# Patient Record
Sex: Female | Born: 1975 | Race: Black or African American | Hispanic: No | Marital: Single | Smoking: Current every day smoker
Health system: Southern US, Community
[De-identification: ages and names within clinical notes are randomized; demographics above are authoritative.]

## PROBLEM LIST (undated history)

## (undated) DIAGNOSIS — G8929 Other chronic pain: Secondary | ICD-10-CM

## (undated) DIAGNOSIS — M797 Fibromyalgia: Secondary | ICD-10-CM

## (undated) DIAGNOSIS — M25569 Pain in unspecified knee: Secondary | ICD-10-CM

## (undated) DIAGNOSIS — E7800 Pure hypercholesterolemia, unspecified: Secondary | ICD-10-CM

## (undated) DIAGNOSIS — I499 Cardiac arrhythmia, unspecified: Secondary | ICD-10-CM

## (undated) HISTORY — PX: CERVICAL DISCECTOMY: SHX98

---

## 2005-09-10 ENCOUNTER — Emergency Department: Payer: Self-pay | Admitting: Emergency Medicine

## 2006-05-03 ENCOUNTER — Other Ambulatory Visit: Payer: Self-pay

## 2006-05-03 ENCOUNTER — Emergency Department: Payer: Self-pay | Admitting: Emergency Medicine

## 2007-07-18 ENCOUNTER — Other Ambulatory Visit: Payer: Self-pay

## 2007-07-18 ENCOUNTER — Emergency Department: Payer: Self-pay | Admitting: Emergency Medicine

## 2008-07-04 ENCOUNTER — Emergency Department: Payer: Self-pay | Admitting: Emergency Medicine

## 2008-07-10 DIAGNOSIS — G4733 Obstructive sleep apnea (adult) (pediatric): Secondary | ICD-10-CM | POA: Insufficient documentation

## 2008-07-18 ENCOUNTER — Emergency Department: Payer: Self-pay

## 2009-12-01 ENCOUNTER — Ambulatory Visit: Payer: Self-pay | Admitting: Cardiovascular Disease

## 2010-02-14 ENCOUNTER — Ambulatory Visit: Payer: Self-pay | Admitting: Gastroenterology

## 2010-05-20 ENCOUNTER — Emergency Department: Payer: Self-pay | Admitting: Emergency Medicine

## 2010-06-09 ENCOUNTER — Emergency Department: Payer: Self-pay | Admitting: Emergency Medicine

## 2011-02-06 ENCOUNTER — Emergency Department: Payer: Self-pay | Admitting: Emergency Medicine

## 2011-07-20 ENCOUNTER — Emergency Department: Payer: Self-pay | Admitting: *Deleted

## 2011-07-20 LAB — URINALYSIS, COMPLETE
Bacteria: NONE SEEN
Bilirubin,UR: NEGATIVE
Blood: NEGATIVE
Ketone: NEGATIVE
Nitrite: NEGATIVE
Protein: NEGATIVE
RBC,UR: 4 /HPF (ref 0–5)
Specific Gravity: 1.02 (ref 1.003–1.030)
WBC UR: 25 /HPF (ref 0–5)

## 2011-07-20 LAB — WET PREP, GENITAL

## 2011-07-20 LAB — PREGNANCY, URINE: Pregnancy Test, Urine: NEGATIVE m[IU]/mL

## 2011-11-23 ENCOUNTER — Emergency Department: Payer: Self-pay | Admitting: Emergency Medicine

## 2011-12-12 ENCOUNTER — Emergency Department: Payer: Self-pay | Admitting: *Deleted

## 2011-12-12 LAB — CBC
HCT: 40.5 % (ref 35.0–47.0)
MCH: 24.7 pg — ABNORMAL LOW (ref 26.0–34.0)
MCHC: 31.5 g/dL — ABNORMAL LOW (ref 32.0–36.0)
MCV: 78 fL — ABNORMAL LOW (ref 80–100)

## 2011-12-12 LAB — BASIC METABOLIC PANEL
Anion Gap: 9 (ref 7–16)
BUN: 13 mg/dL (ref 7–18)
Calcium, Total: 9.3 mg/dL (ref 8.5–10.1)
Co2: 24 mmol/L (ref 21–32)
EGFR (African American): >60
EGFR (Non-African Amer.): >60
Potassium: 4.3 mmol/L (ref 3.5–5.1)
Sodium: 138 mmol/L (ref 136–145)

## 2011-12-12 LAB — CK TOTAL AND CKMB (NOT AT ARMC): CK-MB: 1.3 ng/mL (ref 0.5–3.6)

## 2012-06-24 ENCOUNTER — Ambulatory Visit: Payer: Self-pay | Admitting: Neurology

## 2012-11-13 ENCOUNTER — Ambulatory Visit: Payer: Self-pay | Admitting: Nurse Practitioner

## 2012-11-29 ENCOUNTER — Ambulatory Visit: Payer: Self-pay | Admitting: Nurse Practitioner

## 2013-02-10 ENCOUNTER — Ambulatory Visit: Payer: Self-pay | Admitting: Pain Medicine

## 2013-02-20 ENCOUNTER — Ambulatory Visit: Payer: Self-pay | Admitting: Pain Medicine

## 2013-04-02 ENCOUNTER — Ambulatory Visit: Payer: Self-pay | Admitting: Pain Medicine

## 2013-04-02 DIAGNOSIS — M797 Fibromyalgia: Secondary | ICD-10-CM | POA: Diagnosis present

## 2013-04-15 ENCOUNTER — Ambulatory Visit: Payer: Self-pay | Admitting: Pain Medicine

## 2013-04-22 ENCOUNTER — Emergency Department: Payer: Self-pay | Admitting: Emergency Medicine

## 2013-04-22 LAB — URINALYSIS, COMPLETE
Bilirubin,UR: NEGATIVE
Blood: NEGATIVE
Ketone: NEGATIVE
Nitrite: NEGATIVE
Ph: 6 (ref 4.5–8.0)
RBC,UR: 9 /HPF (ref 0–5)
WBC UR: 37 /HPF (ref 0–5)

## 2013-04-22 LAB — DIFFERENTIAL
Basophil #: 0.1 10*3/uL (ref 0.0–0.1)
Basophil %: 0.8 %
Eosinophil %: 1.5 %
Lymphocyte #: 4.5 10*3/uL — ABNORMAL HIGH (ref 1.0–3.6)
Lymphocyte %: 33.5 %
Neutrophil #: 7.9 10*3/uL — ABNORMAL HIGH (ref 1.4–6.5)
Neutrophil %: 58.5 %

## 2013-04-22 LAB — COMPREHENSIVE METABOLIC PANEL
Albumin: 3.3 g/dL — ABNORMAL LOW (ref 3.4–5.0)
Alkaline Phosphatase: 108 U/L (ref 50–136)
Anion Gap: 7 (ref 7–16)
BUN: 7 mg/dL (ref 7–18)
Bilirubin,Total: 0.3 mg/dL (ref 0.2–1.0)
Calcium, Total: 8.8 mg/dL (ref 8.5–10.1)
Chloride: 105 mmol/L (ref 98–107)
Creatinine: 1.01 mg/dL (ref 0.60–1.30)
EGFR (African American): >60
EGFR (Non-African Amer.): >60
Osmolality: 268 (ref 275–301)
Potassium: 3.9 mmol/L (ref 3.5–5.1)
SGOT(AST): 34 U/L (ref 15–37)
SGPT (ALT): 47 U/L (ref 12–78)
Sodium: 135 mmol/L — ABNORMAL LOW (ref 136–145)

## 2013-04-22 LAB — CBC
HCT: 41.6 % (ref 35.0–47.0)
HGB: 13.3 g/dL (ref 12.0–16.0)
MCH: 24.1 pg — ABNORMAL LOW (ref 26.0–34.0)
MCHC: 31.9 g/dL — ABNORMAL LOW (ref 32.0–36.0)
MCV: 76 fL — ABNORMAL LOW (ref 80–100)
Platelet: 328 10*3/uL (ref 150–440)
RBC: 5.52 10*6/uL — ABNORMAL HIGH (ref 3.80–5.20)
RDW: 14.5 % (ref 11.5–14.5)
WBC: 13.5 10*3/uL — ABNORMAL HIGH (ref 3.6–11.0)

## 2013-04-23 LAB — TROPONIN I: Troponin-I: <0.02 ng/mL

## 2013-05-19 ENCOUNTER — Ambulatory Visit: Payer: Self-pay | Admitting: Pain Medicine

## 2013-06-13 DIAGNOSIS — F1190 Opioid use, unspecified, uncomplicated: Secondary | ICD-10-CM | POA: Insufficient documentation

## 2013-12-19 DIAGNOSIS — M51369 Other intervertebral disc degeneration, lumbar region without mention of lumbar back pain or lower extremity pain: Secondary | ICD-10-CM | POA: Insufficient documentation

## 2013-12-19 DIAGNOSIS — M5136 Other intervertebral disc degeneration, lumbar region: Secondary | ICD-10-CM | POA: Insufficient documentation

## 2013-12-19 DIAGNOSIS — F319 Bipolar disorder, unspecified: Secondary | ICD-10-CM | POA: Diagnosis present

## 2013-12-19 DIAGNOSIS — K219 Gastro-esophageal reflux disease without esophagitis: Secondary | ICD-10-CM | POA: Insufficient documentation

## 2013-12-19 DIAGNOSIS — M5030 Other cervical disc degeneration, unspecified cervical region: Secondary | ICD-10-CM | POA: Insufficient documentation

## 2014-08-02 ENCOUNTER — Emergency Department: Payer: Self-pay | Admitting: Emergency Medicine

## 2014-08-02 LAB — CBC
HCT: 42.7 % (ref 35.0–47.0)
HGB: 13.2 g/dL (ref 12.0–16.0)
MCH: 23.9 pg — AB (ref 26.0–34.0)
MCHC: 31 g/dL — ABNORMAL LOW (ref 32.0–36.0)
MCV: 77 fL — ABNORMAL LOW (ref 80–100)
PLATELETS: 335 10*3/uL (ref 150–440)
RBC: 5.53 10*6/uL — ABNORMAL HIGH (ref 3.80–5.20)
RDW: 13.5 % (ref 11.5–14.5)
WBC: 11.4 10*3/uL — ABNORMAL HIGH (ref 3.6–11.0)

## 2014-08-02 LAB — BASIC METABOLIC PANEL
Anion Gap: 7 (ref 7–16)
BUN: 6 mg/dL — AB (ref 7–18)
Calcium, Total: 8.9 mg/dL (ref 8.5–10.1)
Chloride: 108 mmol/L — ABNORMAL HIGH (ref 98–107)
Co2: 25 mmol/L (ref 21–32)
Creatinine: 1.1 mg/dL (ref 0.60–1.30)
GFR CALC NON AF AMER: 58 — AB
Glucose: 86 mg/dL (ref 65–99)
Osmolality: 276 (ref 275–301)
Potassium: 3.7 mmol/L (ref 3.5–5.1)
Sodium: 140 mmol/L (ref 136–145)

## 2014-08-02 LAB — D-DIMER(ARMC): D-Dimer: 349 ng/ml

## 2014-08-02 LAB — TROPONIN I

## 2014-08-02 LAB — PRO B NATRIURETIC PEPTIDE: B-Type Natriuretic Peptide: 44 pg/mL (ref 0–125)

## 2014-08-04 ENCOUNTER — Emergency Department: Payer: Self-pay | Admitting: Emergency Medicine

## 2014-08-04 LAB — BASIC METABOLIC PANEL
Anion Gap: 7 (ref 7–16)
BUN: 7 mg/dL (ref 7–18)
CALCIUM: 8.9 mg/dL (ref 8.5–10.1)
Chloride: 106 mmol/L (ref 98–107)
Co2: 28 mmol/L (ref 21–32)
Creatinine: 1.18 mg/dL (ref 0.60–1.30)
EGFR (Non-African Amer.): 54 — ABNORMAL LOW
Glucose: 95 mg/dL (ref 65–99)
Osmolality: 279 (ref 275–301)
Potassium: 3.9 mmol/L (ref 3.5–5.1)
Sodium: 141 mmol/L (ref 136–145)

## 2014-08-04 LAB — CBC
HCT: 42.1 % (ref 35.0–47.0)
HGB: 13.1 g/dL (ref 12.0–16.0)
MCH: 24 pg — ABNORMAL LOW (ref 26.0–34.0)
MCHC: 31.2 g/dL — AB (ref 32.0–36.0)
MCV: 77 fL — ABNORMAL LOW (ref 80–100)
PLATELETS: 336 10*3/uL (ref 150–440)
RBC: 5.46 10*6/uL — ABNORMAL HIGH (ref 3.80–5.20)
RDW: 13.5 % (ref 11.5–14.5)
WBC: 9.9 10*3/uL (ref 3.6–11.0)

## 2014-08-04 LAB — PRO B NATRIURETIC PEPTIDE: B-Type Natriuretic Peptide: 12 pg/mL (ref 0–125)

## 2014-08-04 LAB — TROPONIN I: Troponin-I: <0.02 ng/mL

## 2014-08-05 LAB — TROPONIN I: Troponin-I: <0.02 ng/mL

## 2015-02-25 ENCOUNTER — Emergency Department: Payer: Self-pay

## 2015-02-25 ENCOUNTER — Encounter: Payer: Self-pay | Admitting: Emergency Medicine

## 2015-02-25 ENCOUNTER — Other Ambulatory Visit: Payer: Self-pay

## 2015-02-25 ENCOUNTER — Emergency Department
Admission: EM | Admit: 2015-02-25 | Discharge: 2015-02-25 | Discharge status: Against Medical Advice | Payer: Self-pay | Attending: Student | Admitting: Student

## 2015-02-25 DIAGNOSIS — R110 Nausea: Secondary | ICD-10-CM | POA: Insufficient documentation

## 2015-02-25 DIAGNOSIS — R079 Chest pain, unspecified: Secondary | ICD-10-CM | POA: Insufficient documentation

## 2015-02-25 DIAGNOSIS — R0602 Shortness of breath: Secondary | ICD-10-CM | POA: Insufficient documentation

## 2015-02-25 LAB — CBC
HCT: 41.3 % (ref 35.0–47.0)
Hemoglobin: 12.9 g/dL (ref 12.0–16.0)
MCH: 24 pg — ABNORMAL LOW (ref 26.0–34.0)
MCHC: 31.4 g/dL — AB (ref 32.0–36.0)
MCV: 76.6 fL — ABNORMAL LOW (ref 80.0–100.0)
PLATELETS: 301 10*3/uL (ref 150–440)
RBC: 5.39 MIL/uL — ABNORMAL HIGH (ref 3.80–5.20)
RDW: 14.2 % (ref 11.5–14.5)
WBC: 10.6 10*3/uL (ref 3.6–11.0)

## 2015-02-25 LAB — BASIC METABOLIC PANEL
Anion gap: 7 (ref 5–15)
BUN: 8 mg/dL (ref 6–20)
CALCIUM: 9 mg/dL (ref 8.9–10.3)
CO2: 24 mmol/L (ref 22–32)
Chloride: 106 mmol/L (ref 101–111)
Creatinine, Ser: 1.06 mg/dL — ABNORMAL HIGH (ref 0.44–1.00)
GFR calc Af Amer: >60 mL/min (ref >60)
Glucose, Bld: 124 mg/dL — ABNORMAL HIGH (ref 65–99)
POTASSIUM: 3.9 mmol/L (ref 3.5–5.1)
SODIUM: 137 mmol/L (ref 135–145)

## 2015-02-25 NOTE — ED Notes (Signed)
Patient to ED with c/o chest pain and some nausea since yesterday patient reports some shortness of breath.

## 2015-07-21 ENCOUNTER — Emergency Department: Payer: Medicaid Other

## 2015-07-21 ENCOUNTER — Emergency Department
Admission: EM | Admit: 2015-07-21 | Discharge: 2015-07-21 | Disposition: A | Discharge status: Home / Self Care | Payer: Medicaid Other | Attending: Emergency Medicine | Admitting: Emergency Medicine

## 2015-07-21 DIAGNOSIS — M25552 Pain in left hip: Secondary | ICD-10-CM | POA: Insufficient documentation

## 2015-07-21 DIAGNOSIS — Y998 Other external cause status: Secondary | ICD-10-CM | POA: Insufficient documentation

## 2015-07-21 DIAGNOSIS — W009XXA Unspecified fall due to ice and snow, initial encounter: Secondary | ICD-10-CM

## 2015-07-21 DIAGNOSIS — S39012A Strain of muscle, fascia and tendon of lower back, initial encounter: Secondary | ICD-10-CM | POA: Insufficient documentation

## 2015-07-21 DIAGNOSIS — Y9389 Activity, other specified: Secondary | ICD-10-CM | POA: Insufficient documentation

## 2015-07-21 DIAGNOSIS — Z3202 Encounter for pregnancy test, result negative: Secondary | ICD-10-CM | POA: Insufficient documentation

## 2015-07-21 DIAGNOSIS — S79922A Unspecified injury of left thigh, initial encounter: Secondary | ICD-10-CM | POA: Insufficient documentation

## 2015-07-21 DIAGNOSIS — W000XXA Fall on same level due to ice and snow, initial encounter: Secondary | ICD-10-CM | POA: Insufficient documentation

## 2015-07-21 DIAGNOSIS — Y92009 Unspecified place in unspecified non-institutional (private) residence as the place of occurrence of the external cause: Secondary | ICD-10-CM | POA: Insufficient documentation

## 2015-07-21 DIAGNOSIS — S300XXA Contusion of lower back and pelvis, initial encounter: Secondary | ICD-10-CM | POA: Insufficient documentation

## 2015-07-21 LAB — URINALYSIS COMPLETE WITH MICROSCOPIC (ARMC ONLY)
BACTERIA UA: NONE SEEN
Bilirubin Urine: NEGATIVE
GLUCOSE, UA: NEGATIVE mg/dL
Hgb urine dipstick: NEGATIVE
Ketones, ur: NEGATIVE mg/dL
Leukocytes, UA: NEGATIVE
NITRITE: NEGATIVE
Protein, ur: NEGATIVE mg/dL
Specific Gravity, Urine: 1.014 (ref 1.005–1.030)
pH: 6 (ref 5.0–8.0)

## 2015-07-21 MED ORDER — CYCLOBENZAPRINE HCL 10 MG PO TABS
10.0000 mg | ORAL_TABLET | Freq: Once | ORAL | Status: DC
  Filled 2015-07-21: qty 1

## 2015-07-21 MED ORDER — CYCLOBENZAPRINE HCL 5 MG PO TABS: 5.0000 mg | ORAL_TABLET | Freq: Three times a day (TID) | ORAL | Status: DC | PRN

## 2015-07-21 MED ORDER — KETOROLAC TROMETHAMINE 10 MG PO TABS: 10.0000 mg | ORAL_TABLET | Freq: Three times a day (TID) | ORAL | Status: DC

## 2015-07-21 MED ORDER — TRAMADOL HCL 50 MG PO TABS: 50.0000 mg | ORAL_TABLET | Freq: Two times a day (BID) | ORAL | Status: DC

## 2015-07-21 MED ORDER — KETOROLAC TROMETHAMINE 60 MG/2ML IM SOLN
60.0000 mg | Freq: Once | INTRAMUSCULAR | Status: AC
  Administered 2015-07-21: 60 mg via INTRAMUSCULAR
  Filled 2015-07-21: qty 2

## 2015-07-21 NOTE — ED Notes (Signed)
Pt comes to ED w/ c/o pain in L arm, lower back and L leg after fall yesterday.  Pt denies LOC, CP or SOB. NAD.  Pt able to ambulate to room

## 2015-07-21 NOTE — Discharge Instructions (Signed)
Lumbosacral Strain Lumbosacral strain is a strain of any of the parts that make up your lumbosacral vertebrae. Your lumbosacral vertebrae are the bones that make up the lower third of your backbone. Your lumbosacral vertebrae are held together by muscles and tough, fibrous tissue (ligaments).  CAUSES  A sudden blow to your back can cause lumbosacral strain. Also, anything that causes an excessive stretch of the muscles in the low back can cause this strain. This is typically seen when people exert themselves strenuously, fall, lift heavy objects, bend, or crouch repeatedly. RISK FACTORS  Physically demanding work.  Participation in pushing or pulling sports or sports that require a sudden twist of the back (tennis, golf, baseball).  Weight lifting.  Excessive lower back curvature.  Forward-tilted pelvis.  Weak back or abdominal muscles or both.  Tight hamstrings. SIGNS AND SYMPTOMS  Lumbosacral strain may cause pain in the area of your injury or pain that moves (radiates) down your leg.  DIAGNOSIS Your health care provider can often diagnose lumbosacral strain through a physical exam. In some cases, you may need tests such as X-ray exams.  TREATMENT  Treatment for your lower back injury depends on many factors that your clinician will have to evaluate. However, most treatment will include the use of anti-inflammatory medicines. HOME CARE INSTRUCTIONS   Avoid hard physical activities (tennis, racquetball, waterskiing) if you are not in proper physical condition for it. This may aggravate or create problems.  If you have a back problem, avoid sports requiring sudden body movements. Swimming and walking are generally safer activities.  Maintain good posture.  Maintain a healthy weight.  For acute conditions, you may put ice on the injured area.  Put ice in a plastic bag.  Place a towel between your skin and the bag.  Leave the ice on for 20 minutes, 2-3 times a day.  When the  low back starts healing, stretching and strengthening exercises may be recommended. SEEK MEDICAL CARE IF:  Your back pain is getting worse.  You experience severe back pain not relieved with medicines. SEEK IMMEDIATE MEDICAL CARE IF:   You have numbness, tingling, weakness, or problems with the use of your arms or legs.  There is a change in bowel or bladder control.  You have increasing pain in any area of the body, including your belly (abdomen).  You notice shortness of breath, dizziness, or feel faint.  You feel sick to your stomach (nauseous), are throwing up (vomiting), or become sweaty.  You notice discoloration of your toes or legs, or your feet get very cold. MAKE SURE YOU:   Understand these instructions.  Will watch your condition.  Will get help right away if you are not doing well or get worse.   This information is not intended to replace advice given to you by your health care provider. Make sure you discuss any questions you have with your health care provider.   Document Released: 04/05/2005 Document Revised: 07/17/2014 Document Reviewed: 02/12/2013 Elsevier Interactive Patient Education [employer]  Your exam is normal today following your fall. There if no evidence of fracture to the tailbone. You should apply warm compresses to any sore muscles. Follow-up with your provider for ongoing symptoms. Take the prescription meds as directed.

## 2015-07-21 NOTE — ED Notes (Signed)
POC PREG NEGATIVE  

## 2015-07-21 NOTE — ED Notes (Signed)
Fall on ice yesterday, pain to tailbone and lower back. Pt alert and oriented X4, active, cooperative, pt in NAD. RR even and unlabored, color WNL.  Ambulatory.

## 2015-07-21 NOTE — ED Provider Notes (Signed)
[facility] Emergency Department Provider Note ____________________________________________  Time seen: 1803  I have reviewed the triage vital signs and the nursing notes.  HISTORY  Chief Complaint  Fall  HPI Priscilla Moore is a 40 y.o. female presents to the ED for evaluation of low back pain and tailbone pain status post a fall yesterday. She describes slipping on icy curb outside of her house. She landed on her buttocks and lower back. She denies any head injury, loss of consciousness, laceration, or abrasion. She has been applying heating pad to the area for comfort in the last 24 hours. She also notes some pain over the left buttocks and left anterior thigh. She denies any distal paresthesias, foot drop, or leg weakness. She is dosed her Ultram but denies any significant benefit to her symptoms.She rates the discomfort in her low back as an 8/10 in triage.  History reviewed. No pertinent past medical history.  There are no active problems to display for this patient.  History reviewed. No pertinent past surgical history.   Current Outpatient Rx  Name  Route  Sig  Dispense  Refill  . cyclobenzaprine (FLEXERIL) 5 MG tablet   Oral   Take 1 tablet (5 mg total) by mouth 3 (three) times daily as needed for muscle spasms.   15 tablet   0   . ketorolac (TORADOL) 10 MG tablet   Oral   Take 1 tablet (10 mg total) by mouth every 8 (eight) hours.   15 tablet   0   . traMADol (ULTRAM) 50 MG tablet   Oral   Take 1 tablet (50 mg total) by mouth 2 (two) times daily.   10 tablet   0    Allergies Review of patient's allergies indicates no known allergies.  No family history on file.  Social History Social History  Substance Use Topics  . Smoking status: Never Smoker   . Smokeless tobacco: None  . Alcohol Use: No   Review of Systems  Constitutional: Negative for fever. Eyes: Negative for visual changes. ENT: Negative for sore  throat. Cardiovascular: Negative for chest pain. Respiratory: Negative for shortness of breath. Gastrointestinal: Negative for abdominal pain, vomiting and diarrhea. Genitourinary: Negative for dysuria. Musculoskeletal: Positive for back pain. Skin: Negative for rash. Neurological: Negative for headaches, focal weakness or numbness. No incontinence of bladder or bowel. ____________________________________________  PHYSICAL EXAM:  VITAL SIGNS: ED Triage Vitals  Enc Vitals Group     BP 07/21/15 1748 140/83 mmHg     Pulse Rate 07/21/15 1747 90     Resp 07/21/15 1747 18     Temp 07/21/15 1747 98.2 F (36.8 C)     Temp Source 07/21/15 1747 Oral     SpO2 07/21/15 1747 98 %     Weight 07/21/15 1747 260 lb (117.935 kg)     Height 07/21/15 1747 5\' 8"  (1.727 m)     Head Cir --      Peak Flow --      Pain Score 07/21/15 1746 8     Pain Loc --      Pain Edu? --      Excl. in GC? --    Constitutional: Alert and oriented. Well appearing and in no distress. Head: Normocephalic and atraumatic.      Eyes: Conjunctivae are normal. PERRL. Normal extraocular movements      Ears: Canals clear. TMs intact bilaterally.   Nose: No congestion/rhinorrhea.   Mouth/Throat: Mucous membranes are moist.  Neck: Supple. No thyromegaly. Hematological/Lymphatic/Immunological: No cervical lymphadenopathy. Cardiovascular: Normal rate, regular rhythm.  Respiratory: Normal respiratory effort. No wheezes/rales/rhonchi. Gastrointestinal: Soft and nontender. No distention. Musculoskeletal: Patient with normal spinal alignment she is without any significant midline tenderness, spasm, or deformity. She isn't to palpation over the midline sacrum and coccyx. She also has some pain to through the left buttocks and anterior lateral hip. She is able to transition from supine to sit without difficulty. Nontender with normal range of motion in all extremities.  Neurologic: Cranial nerves II through XII grossly  intact. Normal LE DTRs bilaterally. Normal toe dorsiflexion and foot eversion exam.  She is able to demonstrated negative seated straight leg raise. Normal gait without ataxia. Normal speech and language. No gross focal neurologic deficits are appreciated. Skin:  Skin is warm, dry and intact. No rash noted. Psychiatric: Mood and affect are normal. Patient exhibits appropriate insight and judgment. ____________________________________________    LABS (pertinent positives/negatives) Labs Reviewed  URINALYSIS COMPLETEWITH MICROSCOPIC ([facility] ONLY) - Abnormal; Notable for the following:    Color, Urine YELLOW (*)    APPearance CLEAR (*)    Squamous Epithelial / LPF 0-5 (*)    All other components within normal limits  POC URINE PREG, ED  ____________________________________________   RADIOLOGY  Coccyx IMPRESSION: Negative. ____________________________________________  PROCEDURES  Toradol 60 mg IM ____________________________________________  INITIAL IMPRESSION / ASSESSMENT AND PLAN / ED COURSE  Patient with a slip and fall on ice with subsequent coccyx contusion. She also has some mild lumbar strain symptoms. She is noted to have a normal exam and negative x-ray for any coccyx injury. She'll be discharged with prescriptions for Flexeril, Ultram, and the dose as directed. Work note was provided for 2 days out of work as requested. She'll follow with primary care provider for ongoing symptoms. ____________________________________________  FINAL CLINICAL IMPRESSION(S) / ED DIAGNOSES  Final diagnoses:  Fall from slipping on ice, initial encounter  Coccyx contusion, initial encounter  Lumbar strain, initial encounter      Lissa HoardJenise V Bacon Celes Dedic, PA-C 07/21/15 1954  Governor Rooksebecca Lord, MD 07/21/15 2340

## 2016-01-22 DIAGNOSIS — M79662 Pain in left lower leg: Secondary | ICD-10-CM | POA: Insufficient documentation

## 2016-01-22 DIAGNOSIS — J40 Bronchitis, not specified as acute or chronic: Secondary | ICD-10-CM | POA: Insufficient documentation

## 2016-01-22 DIAGNOSIS — F17210 Nicotine dependence, cigarettes, uncomplicated: Secondary | ICD-10-CM | POA: Insufficient documentation

## 2016-01-22 DIAGNOSIS — Z79899 Other long term (current) drug therapy: Secondary | ICD-10-CM | POA: Insufficient documentation

## 2016-01-23 ENCOUNTER — Emergency Department: Payer: Medicaid Other

## 2016-01-23 ENCOUNTER — Encounter: Payer: Self-pay | Admitting: Emergency Medicine

## 2016-01-23 ENCOUNTER — Emergency Department
Admission: EM | Admit: 2016-01-23 | Discharge: 2016-01-23 | Disposition: A | Discharge status: Home / Self Care | Payer: Medicaid Other | Attending: Emergency Medicine | Admitting: Emergency Medicine

## 2016-01-23 DIAGNOSIS — M79605 Pain in left leg: Secondary | ICD-10-CM

## 2016-01-23 DIAGNOSIS — J40 Bronchitis, not specified as acute or chronic: Secondary | ICD-10-CM

## 2016-01-23 HISTORY — DX: Other chronic pain: G89.29

## 2016-01-23 HISTORY — DX: Pain in unspecified knee: M25.569

## 2016-01-23 HISTORY — DX: Fibromyalgia: M79.7

## 2016-01-23 HISTORY — DX: Cardiac arrhythmia, unspecified: I49.9

## 2016-01-23 HISTORY — DX: Pure hypercholesterolemia, unspecified: E78.00

## 2016-01-23 MED ORDER — HYDROCOD POLST-CPM POLST ER 10-8 MG/5ML PO SUER
5.0000 mL | Freq: Once | ORAL | Status: AC
  Administered 2016-01-23: 5 mL via ORAL
  Filled 2016-01-23: qty 5

## 2016-01-23 MED ORDER — HYDROCOD POLST-CPM POLST ER 10-8 MG/5ML PO SUER: 5.0000 mL | Freq: Two times a day (BID) | ORAL | Status: DC

## 2016-01-23 NOTE — ED Provider Notes (Signed)
[facility] Emergency Department Provider Note   ____________________________________________  Time seen: Approximately 4:22 AM  I have reviewed the triage vital signs and the nursing notes.   HISTORY  Chief Complaint Leg Pain; URI; and Shortness of Breath    HPI Priscilla Moore is a 40 y.o. female who presents to the ED from home with a chief complain of cough, runny nose and left leg pain. Patient reports onset of nonproductive cough and runny nose times one week. She is a daily smoker. Also complains of left leg pain on her outer thigh radiating to the top of her shins. Denies associated fever, chills, chest pain, shortness of breath, abdominal pain, nausea, vomiting, diarrhea. Denies recent travel, trauma or OCP use. 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 Nothing makes her symptoms better. Movement and ambulation make her leg pain worse.   Past Medical History  Diagnosis Date  . Fibromyalgia   . Knee pain, chronic   . High cholesterol   . Irregular heart beat     There are no active problems to display for this patient.   Past Surgical History  Procedure Laterality Date  . Cervical discectomy      Current Outpatient Rx  Name  Route  Sig  Dispense  Refill  . gabapentin (NEURONTIN) 300 MG capsule   Oral   Take 900 mg by mouth 3 (three) times daily.         . chlorpheniramine-HYDROcodone (TUSSIONEX PENNKINETIC ER) 10-8 MG/5ML SUER   Oral   Take 5 mLs by mouth 2 (two) times daily.   70 mL   0   . cyclobenzaprine (FLEXERIL) 5 MG tablet   Oral   Take 1 tablet (5 mg total) by mouth 3 (three) times daily as needed for muscle spasms.   15 tablet   0   . ketorolac (TORADOL) 10 MG tablet   Oral   Take 1 tablet (10 mg total) by mouth every 8 (eight) hours.   15 tablet   0   . traMADol (ULTRAM) 50 MG tablet   Oral   Take 1 tablet (50 mg total) by  mouth 2 (two) times daily.   10 tablet   0     Allergies Review of patient's allergies indicates no known allergies.  History reviewed. No pertinent family history.  Social History Social History  Substance Use Topics  . Smoking status: Current Every Day Smoker -- 1.00 packs/day for 25 years    Types: Cigarettes  . Smokeless tobacco: None  . Alcohol Use: Yes     Comment: social drinker    Review of Systems  Constitutional: No fever/chills. Eyes: No visual changes. ENT: Positive for runny nose. No sore throat. Cardiovascular: Denies chest pain. Respiratory: Positive for nonproductive cough. Denies shortness of breath. Gastrointestinal: No abdominal pain.  No nausea, no vomiting.  No diarrhea.  No constipation. Genitourinary: Negative for dysuria. Musculoskeletal: Positive for left leg pain. Negative for back pain. Skin: Negative for rash. Neurological: Negative for headaches, focal weakness or numbness.  10-point ROS otherwise negative.  ____________________________________________   PHYSICAL EXAM:  VITAL SIGNS: ED Triage Vitals  Enc Vitals Group     BP 01/22/16 2358 142/81 mmHg     Pulse Rate 01/22/16 2358 90     Resp 01/22/16 2358 20     Temp 01/22/16 2358 98.2 F (36.8 C)  Temp Source 01/22/16 2358 Oral     SpO2 01/22/16 2358 96 %     Weight 01/22/16 2358 245 lb (111.131 kg)     Height 01/22/16 2358 5\' 8"  (1.727 m)     Head Cir --      Peak Flow --      Pain Score 01/23/16 0308 9     Pain Loc --      Pain Edu? --      Excl. in GC? --     Constitutional: Alert and oriented. Well appearing and in no acute distress. Eyes: Conjunctivae are normal. PERRL. EOMI. Head: Atraumatic. Nose: No congestion/rhinnorhea. Mouth/Throat: Mucous membranes are moist.  Oropharynx non-erythematous. Neck: No stridor.   Cardiovascular: Normal rate, regular rhythm. Grossly normal heart sounds.  Good peripheral circulation. Respiratory: Normal respiratory effort.  No  retractions. Lungs CTAB. Gastrointestinal: Soft and nontender. No distention. No abdominal bruits. No CVA tenderness. Musculoskeletal: Left calf mildly tender to palpation. There is no associated swelling or edema.  No joint effusions. Full range of motion left hip, knee, ankle without difficulty. 2+ femoral and distal pulses. Symmetrically warm limb without evidence for ischemia. Neurologic:  Normal speech and language. No gross focal neurologic deficits are appreciated.  Skin:  Skin is warm, dry and intact. No rash noted. Psychiatric: Mood and affect are normal. Speech and behavior are normal.  ____________________________________________   LABS (all labs ordered are listed, but only abnormal results are displayed)  Labs Reviewed - No data to display ____________________________________________  EKG  ED ECG REPORT I, Marquetta Weiskopf J, the attending physician, personally viewed and interpreted this ECG.   Date: 01/23/2016  EKG Time: 0007  Rate: 87  Rhythm: normal EKG, normal sinus rhythm  Axis: Normal  Intervals:none  ST&T Change: Nonspecific  ____________________________________________  RADIOLOGY  Ultrasound interpreted per Dr. Andria MeuseStevens: No evidence of deep venous thrombosis.  Chest 2 view (viewed by me, interpreted per Dr. Andria MeuseStevens): Chronic bronchitic changes. No evidence of active pulmonary disease. ____________________________________________   PROCEDURES  Procedure(s) performed: None  Procedures  Critical Care performed: No  ____________________________________________   INITIAL IMPRESSION / ASSESSMENT AND PLAN / ED COURSE  Pertinent labs & imaging results that were available during my care of the patient were reviewed by me and considered in my medical decision making (see chart for details).  40 year old female who presents with viral bronchitis and musculoskeletal leg pain. Offered to check electrolytes and CK which patient declines. Patient requesting work  note so she can "rest her leg". Will prescribe Tussionex as needed for cough and patient will follow-up with her PCP next week. Strict return precautions given. Patient verbalizes understanding and agrees with plan of care. ____________________________________________   FINAL CLINICAL IMPRESSION(S) / ED DIAGNOSES  Final diagnoses:  Bronchitis  Pain of left lower extremity      NEW MEDICATIONS STARTED DURING THIS VISIT:  New Prescriptions   CHLORPHENIRAMINE-HYDROCODONE (TUSSIONEX PENNKINETIC ER) 10-8 MG/5ML SUER    Take 5 mLs by mouth 2 (two) times daily.     Note:  This document was prepared using Dragon voice recognition software and may include unintentional dictation errors.    Irean HongJade J Aqua Denslow, MD 01/23/16 [phone]

## 2016-01-23 NOTE — ED Notes (Addendum)
Pt states she only takes gabapentin because she has no insurance. Pt has a hx of irregular heart beat and was taking metoprolol for this. Pt no longer is on ASA therapy. Pt states she has felt palpitations in the recent past, but has not sought medical care for these.

## 2016-01-23 NOTE — ED Notes (Signed)
Pt c/o cough and runny nose since last Saturday; short of breath when laying down and with exertion; pt smokes 1ppd; pt denies chest pain or fever; pt also having left leg pain; starts on the outer thigh and radiates down across the top of her shin to her toes; history of back problems but never had pain in her leg like this before;

## 2016-01-23 NOTE — ED Notes (Signed)
Pt adds pain/tenderness behind left knee

## 2016-01-23 NOTE — Discharge Instructions (Signed)
1. You may take cough medicine as needed (Tussionex). 2. Return to the ER for worsening symptoms, persistent vomiting, difficulty breathing or other concerns.  Musculoskeletal Pain Musculoskeletal pain is muscle and boney aches and pains. These pains can occur in any part of the body. Your caregiver may treat you without knowing the cause of the pain. They may treat you if blood or urine tests, X-rays, and other tests were normal.  CAUSES There is often not a definite cause or reason for these pains. These pains may be caused by a type of germ (virus). The discomfort may also come from overuse. Overuse includes working out too hard when your body is not fit. Boney aches also come from weather changes. Bone is sensitive to atmospheric pressure changes. HOME CARE INSTRUCTIONS   Ask when your test results will be ready. Make sure you get your test results.  Only take over-the-counter or prescription medicines for pain, discomfort, or fever as directed by your caregiver. If you were given medications for your condition, do not drive, operate machinery or power tools, or sign legal documents for 24 hours. Do not drink alcohol. Do not take sleeping pills or other medications that may interfere with treatment.  Continue all activities unless the activities cause more pain. When the pain lessens, slowly resume normal activities. Gradually increase the intensity and duration of the activities or exercise.  During periods of severe pain, bed rest may be helpful. Lay or sit in any position that is comfortable.  Putting ice on the injured area.  Put ice in a bag.  Place a towel between your skin and the bag.  Leave the ice on for 15 to 20 minutes, 3 to 4 times a day.  Follow up with your caregiver for continued problems and no reason can be found for the pain. If the pain becomes worse or does not go away, it may be necessary to repeat tests or do additional testing. Your caregiver may need to look  further for a possible cause. SEEK IMMEDIATE MEDICAL CARE IF:  You have pain that is getting worse and is not relieved by medications.  You develop chest pain that is associated with shortness or breath, sweating, feeling sick to your stomach (nauseous), or throw up (vomit).  Your pain becomes localized to the abdomen.  You develop any new symptoms that seem different or that concern you. MAKE SURE YOU:   Understand these instructions.  Will watch your condition.  Will get help right away if you are not doing well or get worse.   This information is not intended to replace advice given to you by your health care provider. Make sure you discuss any questions you have with your health care provider.   Document Released: 06/26/2005 Document Revised: 09/18/2011 Document Reviewed: 02/28/2013 Elsevier Interactive Patient Education [employer]

## 2018-04-11 ENCOUNTER — Other Ambulatory Visit: Payer: Self-pay

## 2018-04-11 ENCOUNTER — Emergency Department: Payer: Self-pay

## 2018-04-11 ENCOUNTER — Emergency Department
Admission: EM | Admit: 2018-04-11 | Discharge: 2018-04-11 | Disposition: A | Discharge status: Home / Self Care | Payer: Self-pay | Attending: Emergency Medicine | Admitting: Emergency Medicine

## 2018-04-11 ENCOUNTER — Encounter: Payer: Self-pay | Admitting: Emergency Medicine

## 2018-04-11 DIAGNOSIS — M25561 Pain in right knee: Secondary | ICD-10-CM | POA: Insufficient documentation

## 2018-04-11 DIAGNOSIS — M79604 Pain in right leg: Secondary | ICD-10-CM

## 2018-04-11 DIAGNOSIS — F17210 Nicotine dependence, cigarettes, uncomplicated: Secondary | ICD-10-CM | POA: Insufficient documentation

## 2018-04-11 DIAGNOSIS — R0602 Shortness of breath: Secondary | ICD-10-CM

## 2018-04-11 DIAGNOSIS — M79651 Pain in right thigh: Secondary | ICD-10-CM | POA: Insufficient documentation

## 2018-04-11 DIAGNOSIS — R42 Dizziness and giddiness: Secondary | ICD-10-CM

## 2018-04-11 DIAGNOSIS — M549 Dorsalgia, unspecified: Secondary | ICD-10-CM

## 2018-04-11 DIAGNOSIS — R079 Chest pain, unspecified: Secondary | ICD-10-CM

## 2018-04-11 DIAGNOSIS — N644 Mastodynia: Secondary | ICD-10-CM | POA: Insufficient documentation

## 2018-04-11 LAB — CBC WITH DIFFERENTIAL/PLATELET
BASOS ABS: 0.1 10*3/uL (ref 0–0.1)
Basophils Relative: 1 %
EOS ABS: 0.2 10*3/uL (ref 0–0.7)
Eosinophils Relative: 2 %
HEMATOCRIT: 39.4 % (ref 35.0–47.0)
HEMOGLOBIN: 12.9 g/dL (ref 12.0–16.0)
Lymphocytes Relative: 33 %
Lymphs Abs: 4 10*3/uL — ABNORMAL HIGH (ref 1.0–3.6)
MCH: 25.7 pg — ABNORMAL LOW (ref 26.0–34.0)
MCHC: 32.7 g/dL (ref 32.0–36.0)
MCV: 78.6 fL — ABNORMAL LOW (ref 80.0–100.0)
Monocytes Absolute: 0.5 10*3/uL (ref 0.2–0.9)
Monocytes Relative: 5 %
NEUTROS ABS: 7.2 10*3/uL — AB (ref 1.4–6.5)
NEUTROS PCT: 59 %
Platelets: 306 10*3/uL (ref 150–440)
RBC: 5.02 MIL/uL (ref 3.80–5.20)
RDW: 14.7 % — ABNORMAL HIGH (ref 11.5–14.5)
WBC: 12 10*3/uL — AB (ref 3.6–11.0)

## 2018-04-11 LAB — COMPREHENSIVE METABOLIC PANEL
ALBUMIN: 3.3 g/dL — AB (ref 3.5–5.0)
ALK PHOS: 74 U/L (ref 38–126)
ALT: 38 U/L (ref 0–44)
AST: 36 U/L (ref 15–41)
Anion gap: 7 (ref 5–15)
BILIRUBIN TOTAL: 0.5 mg/dL (ref 0.3–1.2)
BUN: 8 mg/dL (ref 6–20)
CALCIUM: 9 mg/dL (ref 8.9–10.3)
CO2: 26 mmol/L (ref 22–32)
Chloride: 105 mmol/L (ref 98–111)
Creatinine, Ser: 0.89 mg/dL (ref 0.44–1.00)
GFR calc Af Amer: >60 mL/min (ref >60)
GFR calc non Af Amer: >60 mL/min (ref >60)
Glucose, Bld: 134 mg/dL — ABNORMAL HIGH (ref 70–99)
Potassium: 3.6 mmol/L (ref 3.5–5.1)
SODIUM: 138 mmol/L (ref 135–145)
TOTAL PROTEIN: 7.3 g/dL (ref 6.5–8.1)

## 2018-04-11 LAB — TROPONIN I: Troponin I: <0.03 ng/mL (ref <0.03)

## 2018-04-11 MED ORDER — ACETAMINOPHEN 325 MG PO TABS: 650.0000 mg | ORAL_TABLET | Freq: Three times a day (TID) | ORAL | Status: DC | PRN

## 2018-04-11 MED ORDER — KETOROLAC TROMETHAMINE 10 MG PO TABS
10.0000 mg | ORAL_TABLET | Freq: Once | ORAL | Status: AC
  Administered 2018-04-11: 10 mg via ORAL
  Filled 2018-04-11: qty 1

## 2018-04-11 MED ORDER — IBUPROFEN 800 MG PO TABS: 800.0000 mg | ORAL_TABLET | Freq: Three times a day (TID) | ORAL | 0 refills | Status: DC | PRN

## 2018-04-11 NOTE — ED Provider Notes (Signed)
[facility] Emergency Department Provider Note  ____________________________________________  Time seen: Approximately 5:04 PM  I have reviewed the triage vital signs and the nursing notes.   HISTORY  Chief Complaint Shortness of Breath and Knee Pain    HPI Kylea Berrong is a 42 y.o. female with a history of fibromyalgia, osteoarthritis, degenerative disc disease in the spine, presenting for right lower extremity pain.  The patient reports that she was at a funeral on Friday when she developed right knee pain and noted a small amount of swelling every time she would walk on it.  On Monday, she developed a pain on the anterior and posterior aspects of the right leg from the hip down to the toes.  The pain is worst at the calf on the right.  She feels it is difficult to walk due to her pain.  She denies any trauma, numbness or tingling.  She is also had pain sensation over the left breast which is worse if she pushes on it.  She also notes increased pain with exertion.  She has taken gabapentin, which she takes chronically for her fibromyalgia.  This does not feel like a fibromyalgia flare to her.  She has not had any lightheadedness or syncope, palpitations, overlying erythema.  No overlying erythema, swelling, fever, nausea or vomiting.  While in ultrasound, she was told that she may have sciatic nerve pain, and she thinks she might have this but denies any focal back pain, sharp or shooting pains, gluteal pain, or pain that is worse with positional changes.  Past Medical History:  Diagnosis Date  . Fibromyalgia   . High cholesterol   . Irregular heart beat   . Knee pain, chronic     There are no active problems to display for this patient.   Past Surgical History:  Procedure Laterality Date  . CERVICAL DISCECTOMY      Current Outpatient Rx  . Order #: [id number] Class: Print  . Order #: [id number] Class: Print  . Order #: [id number] Class: Historical Med  .  Order #: [id number] Class: Print  . Order #: [id number] Class: Print    Allergies Patient has no known allergies.  No family history on file.  Social History Social History   Tobacco Use  . Smoking status: Current Every Day Smoker    Packs/day: 1.00    Years: 25.00    Pack years: 25.00    Types: Cigarettes  Substance Use Topics  . Alcohol use: Yes    Comment: social drinker  . Drug use: No    Review of Systems Constitutional: No fever/chills.  No lightheadedness or syncope. Eyes: No visual changes. ENT: No sore throat. No congestion or rhinorrhea. Cardiovascular: + chest pain. Denies palpitations. Respiratory: Denies shortness of breath.  No cough. Gastrointestinal: No abdominal pain.  No nausea, no vomiting.  No diarrhea.  No constipation. Genitourinary: Negative for dysuria. Musculoskeletal: Positive chronic neck and back pain, unchanged.  Positive right lower extremity pain. Skin: Negative for rash. Neurological: Negative for headaches. No focal numbness, tingling or weakness.     ____________________________________________   PHYSICAL EXAM:  VITAL SIGNS: ED Triage Vitals  Enc Vitals Group     BP 04/11/18 1239 (!) 133/108     Pulse Rate 04/11/18 1239 (!) 103     Resp --      Temp 04/11/18 1239 97.9 F (36.6 C)     Temp Source 04/11/18 1239 Oral     SpO2 04/11/18 1239 98 %  Weight 04/11/18 1239 238 lb (108 kg)     Height 04/11/18 1239 5\' 8"  (1.727 m)     Head Circumference --      Peak Flow --      Pain Score 04/11/18 1252 10     Pain Loc --      Pain Edu? --      Excl. in GC? --     Constitutional: Alert and oriented.  Answers questions appropriately. Eyes: Conjunctivae are normal.  EOMI. No scleral icterus. Head: Atraumatic. Nose: No congestion/rhinnorhea. Mouth/Throat: Mucous membranes are moist.  Neck: No stridor.  Supple.  No JVD.  No meningismus. Cardiovascular: Normal rate, regular rhythm. No murmurs, rubs or gallops.  CHEST: Tenderness to  palpation diffusely over the entirety of the left breast without any palpable masses or overlying skin changes. Respiratory: Normal respiratory effort.  No accessory muscle use or retractions. Lungs CTAB.  No wheezes, rales or ronchi. Gastrointestinal: Morbidly obese.  Soft, nontender and nondistended.  No guarding or rebound.  No peritoneal signs. Musculoskeletal: No LE edema.  Positive tenderness to palpation diffusely in the right lower extremity regardless of where I touch.  The patient has decreased range of motion of the right hip, knee, and ankle due to pain in the right leg.  She has normal DP and PT pulses and normal sensation to light touch in the lower extremity on the right.  There are no overlying skin changes.  There is no evidence of erythema, effusion over the right knee.  The legs are symmetric to each other.  The patient has diffuse back pain from the mid thoracic spine all the way down to the sacrum in the midline and on the entirety of the back laterally without focality; no step-offs or deformities. Neurologic:  A&Ox3.  Speech is clear.  Face and smile are symmetric.  EOMI.  Moves all extremities well. Skin:  Skin is warm, dry and intact. No rash noted. Psychiatric: Mood and affect are normal.   ____________________________________________   LABS (all labs ordered are listed, but only abnormal results are displayed)  Labs Reviewed  CBC WITH DIFFERENTIAL/PLATELET - Abnormal; Notable for the following components:      Result Value   WBC 12.0 (*)    MCV 78.6 (*)    MCH 25.7 (*)    RDW 14.7 (*)    Neutro Abs 7.2 (*)    Lymphs Abs 4.0 (*)    All other components within normal limits  COMPREHENSIVE METABOLIC PANEL - Abnormal; Notable for the following components:   Glucose, Bld 134 (*)    Albumin 3.3 (*)    All other components within normal limits  TROPONIN I   ____________________________________________  EKG  ED ECG REPORT I, Anne-Caroline Sharma Covert, the attending  physician, personally viewed and interpreted this ECG.   Date: 04/11/2018  EKG Time: 1300  Rate: 95  Rhythm: normal sinus rhythm  Axis: normal  Intervals:none  ST&T Change: No STEMI  ____________________________________________  RADIOLOGY  Dg Chest 2 View  Result Date: 04/11/2018 CLINICAL DATA:  Dizziness for 5 days.  Smoker.  Shortness of breath. EXAM: CHEST - 2 VIEW COMPARISON:  01/23/16 FINDINGS: The heart size and mediastinal contours are within normal limits. Both lungs are clear. The visualized skeletal structures are unremarkable. IMPRESSION: No active cardiopulmonary disease. Electronically Signed   By: Signa Kell M.D.   On: 04/11/2018 13:20   US Venous Img Lower Unilateral Right  Result Date: 04/11/2018 CLINICAL DATA:  Right lower extremity  pain and edema since this past Friday. Chest pain and shortness of breath. Evaluate for DVT. EXAM: RIGHT LOWER EXTREMITY VENOUS DOPPLER ULTRASOUND TECHNIQUE: Gray-scale sonography with graded compression, as well as color Doppler and duplex ultrasound were performed to evaluate the lower extremity deep venous systems from the level of the common femoral vein and including the common femoral, femoral, profunda femoral, popliteal and calf veins including the posterior tibial, peroneal and gastrocnemius veins when visible. The superficial great saphenous vein was also interrogated. Spectral Doppler was utilized to evaluate flow at rest and with distal augmentation maneuvers in the common femoral, femoral and popliteal veins. COMPARISON:  None. FINDINGS: Contralateral Common Femoral Vein: Respiratory phasicity is normal and symmetric with the symptomatic side. No evidence of thrombus. Normal compressibility. Common Femoral Vein: No evidence of thrombus. Normal compressibility, respiratory phasicity and response to augmentation. Saphenofemoral Junction: No evidence of thrombus. Normal compressibility and flow on color Doppler imaging. Profunda Femoral  Vein: No evidence of thrombus. Normal compressibility and flow on color Doppler imaging. Femoral Vein: No evidence of thrombus. Normal compressibility, respiratory phasicity and response to augmentation. Popliteal Vein: No evidence of thrombus. Normal compressibility, respiratory phasicity and response to augmentation. Calf Veins: No evidence of thrombus. Normal compressibility and flow on color Doppler imaging. Superficial Great Saphenous Vein: No evidence of thrombus. Normal compressibility. Venous Reflux:  None. Other Findings:  None. IMPRESSION: No evidence of DVT within the right lower extremity. Electronically Signed   By: Simonne Come M.D.   On: 04/11/2018 14:51    ____________________________________________   PROCEDURES  Procedure(s) performed: None  Procedures  Critical Care performed: No ____________________________________________   INITIAL IMPRESSION / ASSESSMENT AND PLAN / ED COURSE  Pertinent labs & imaging results that were available during my care of the patient were reviewed by me and considered in my medical decision making (see chart for details).  42 y.o. female with morbid obesity, knee arthritis, presenting with diffuse right lower extremity pain on the anterior and posterior aspects, atraumatic MI: Chest pain.  Overall, the patient is mildly hypertensive at 133/108.  Her heart rate is normal on my exam.  Her EKG does not show ischemic changes and her troponin is negative.  Her chest pain is atypical and ACS or MI is very unlikely; PE and aortic pathology are also considered but unlikely.  Her ultrasound does not show any DVT, and her physical examination is not suggestive of a septic arthritis or overlying cellulitis.  Without any trauma, and a reassuring exam, a fracture is not suspected today.  We have talked about the possibility of a fibromyalgia flare, but the patient insists that this is unlike her usual flare.  Sciatica is less likely given the large area of her  back that has pain, and signs and symptoms that would be atypical for this.  Steroids are not indicated today.  I do not suspect spinal cord compression or cauda equina today.  The patient has been offered a pain management plan, with encouragement to continue her gabapentin and follow-up with her primary care physician for further evaluation.  She has also been offered crutches to help with stability.  The patient is dissatisfied that a precise reason for her leg pain has not been discovered today, but I have reassured her about the negative findings in her examination and pain control plan as well as close follow-up plan.  Return precautions were discussed.  ____________________________________________  FINAL CLINICAL IMPRESSION(S) / ED DIAGNOSES  Final diagnoses:  Right leg pain  Breast pain, left         NEW MEDICATIONS STARTED DURING THIS VISIT:  New Prescriptions   IBUPROFEN (ADVIL,MOTRIN) 800 MG TABLET    Take 1 tablet (800 mg total) by mouth every 8 (eight) hours as needed for mild pain or moderate pain (with food).      Rockne Menghini, MD 04/11/18 1715

## 2018-04-11 NOTE — ED Notes (Signed)
Pt's crutches adjusted to height; educated on d/c info; work note given.

## 2018-04-11 NOTE — Discharge Instructions (Addendum)
You may use crutches to support your leg until your pain improves.  You may take Tylenol or Motrin for your pain, and follow-up with your primary care physician for long-term pain management plan, and further evaluation as indicated, if you continue to have pain.  Return to the emergency department if you develop severe pain, lightheadedness or fainting, fever, or any other symptoms concerning to you.

## 2018-04-11 NOTE — ED Triage Notes (Signed)
Pt arrived with pain to right leg that started on Friday. Pt denies any new injury. Pt states the pain is now in her right calf. Pt also reports shortness of breath, chest pain, and dizziness that started today. PT reports her chest hurts on the left side and feels sharp.

## 2019-03-14 IMAGING — US US EXTREM LOW VENOUS*R*
1 series · 13 of 24 positions shown · non-contrast
Comparison: None.

CLINICAL DATA: Right lower extremity pain and edema since this past
[REDACTED]. Chest pain and shortness of breath. Evaluate for DVT.



[Series 1: us extrem low venous*right* · 0.08mm/px · 13 of 34 slices shown]
[im 1/34]
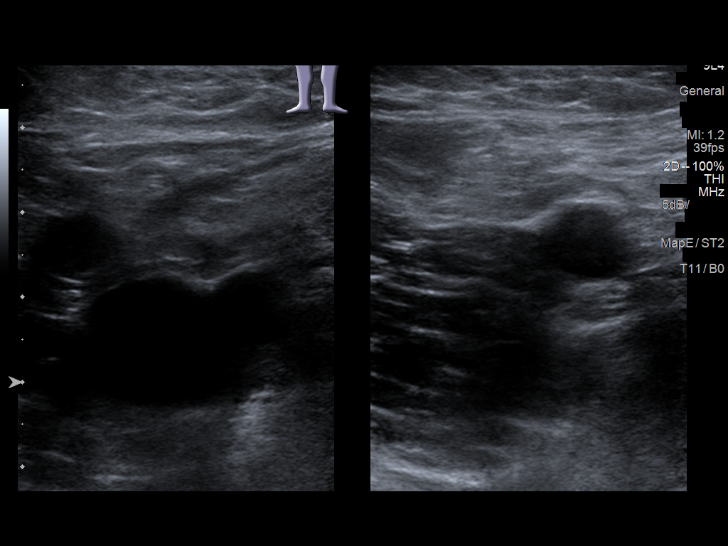
[im 3/34]
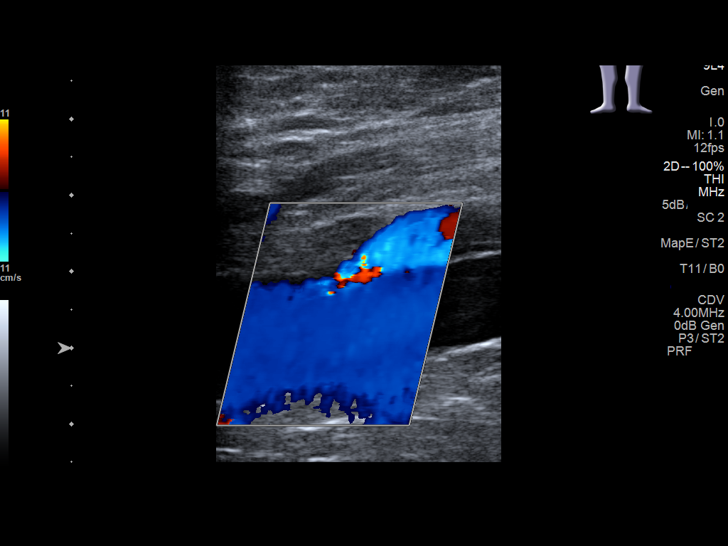
[im 6/34]
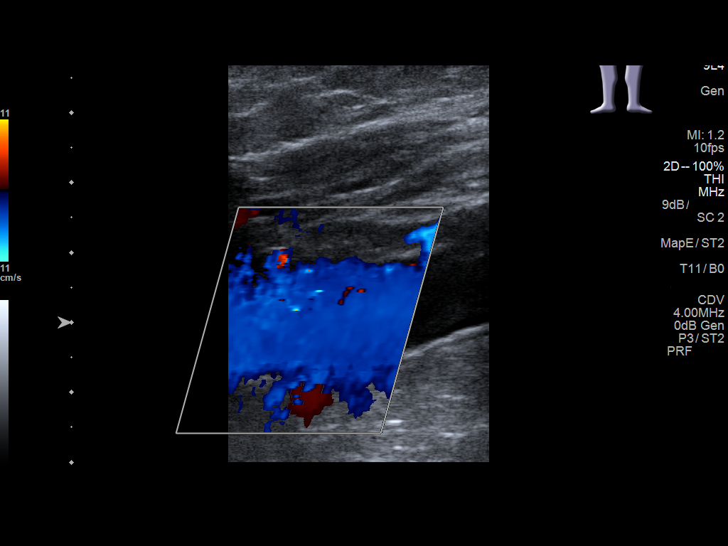
[im 9/34]
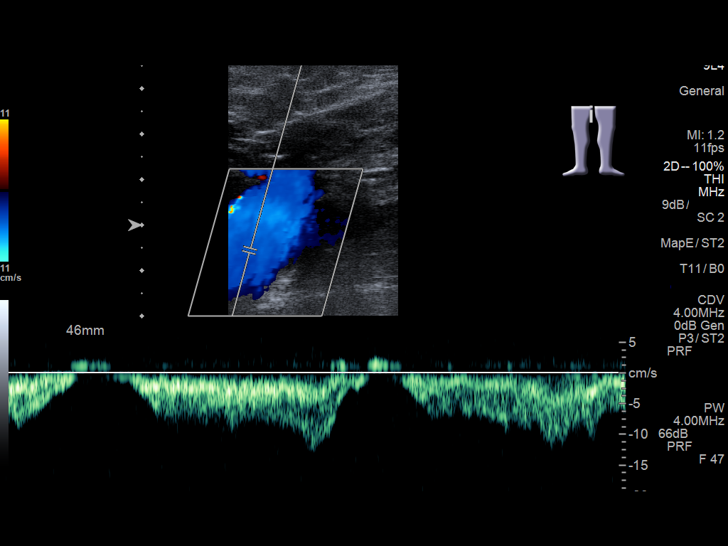
[im 12/34]
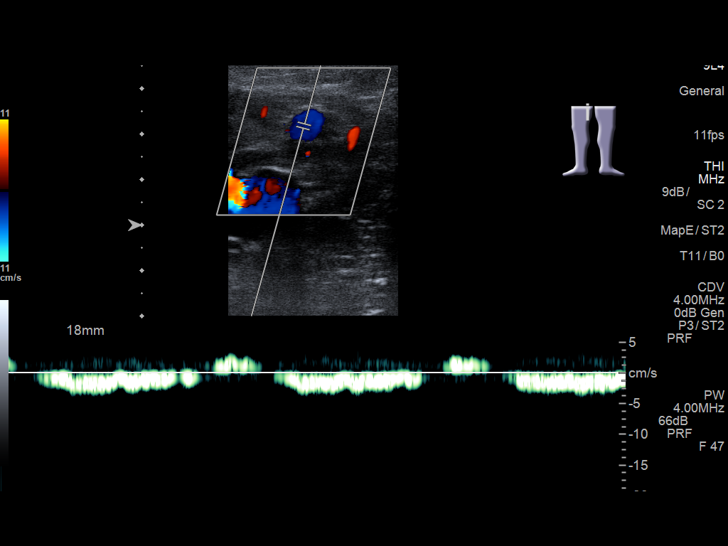
[im 15/34]
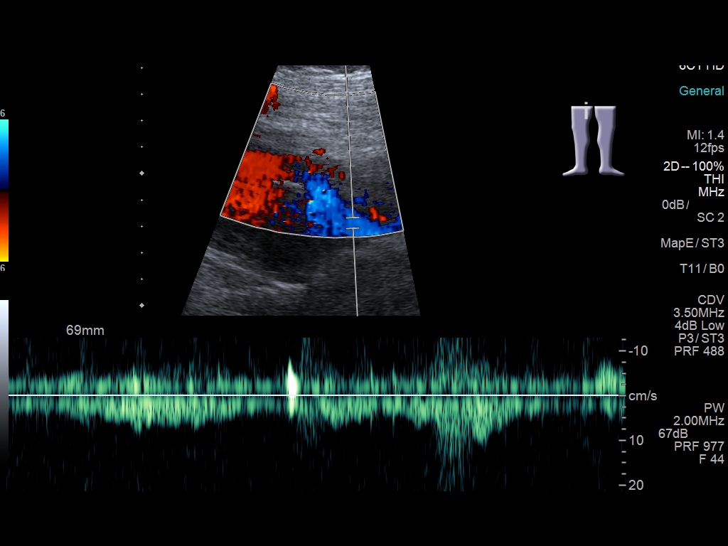
[im 18/34]
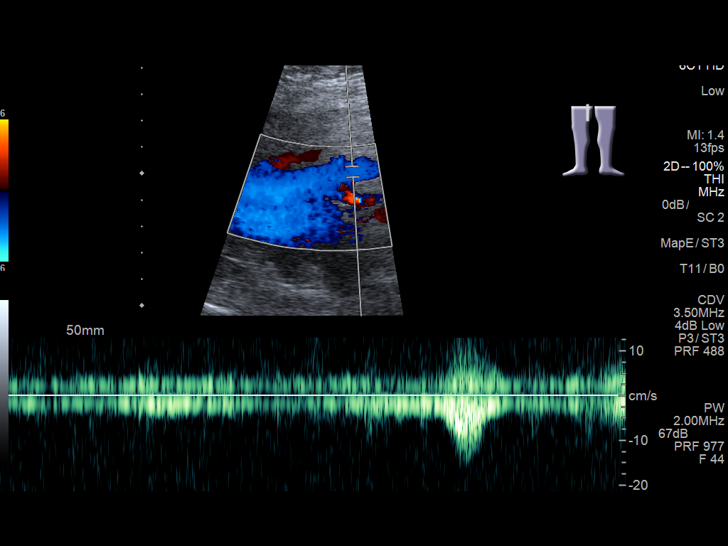
[im 19/34]
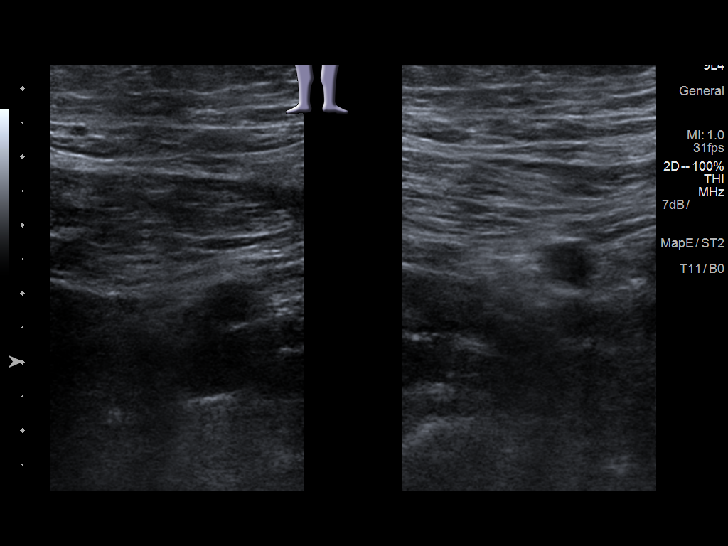
[im 22/34]
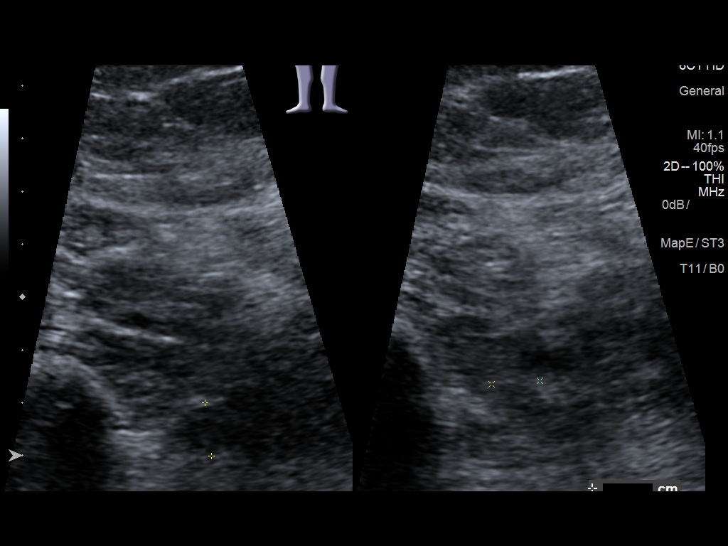
[im 25/34]
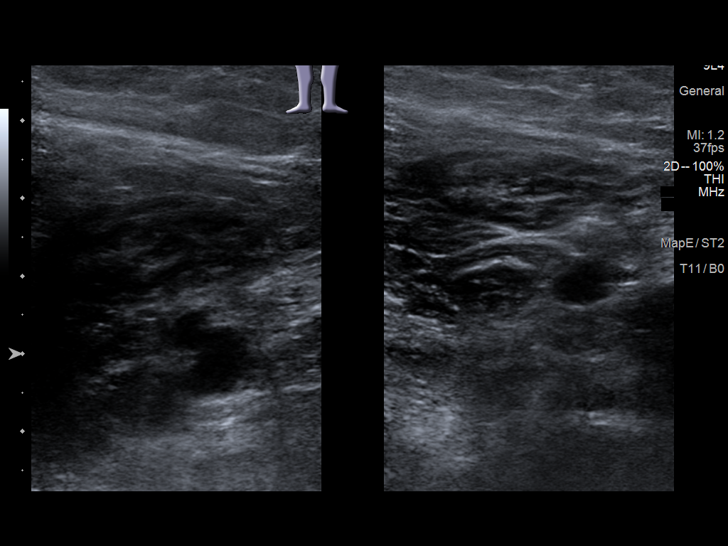
[im 28/34]
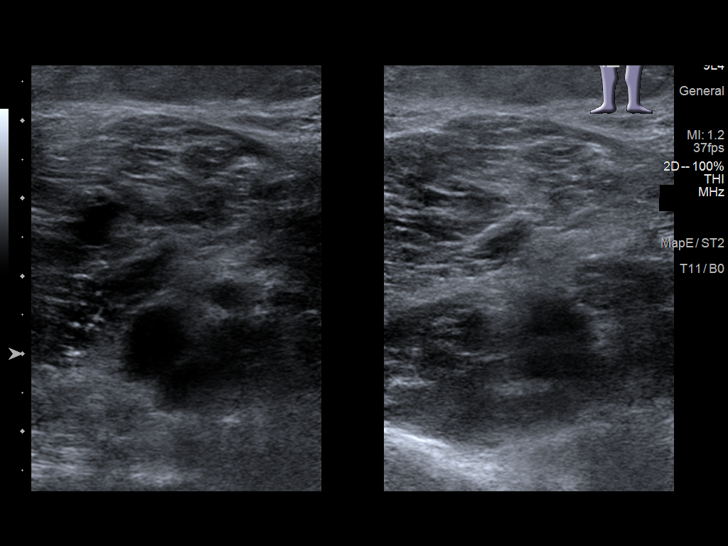
[im 31/34]
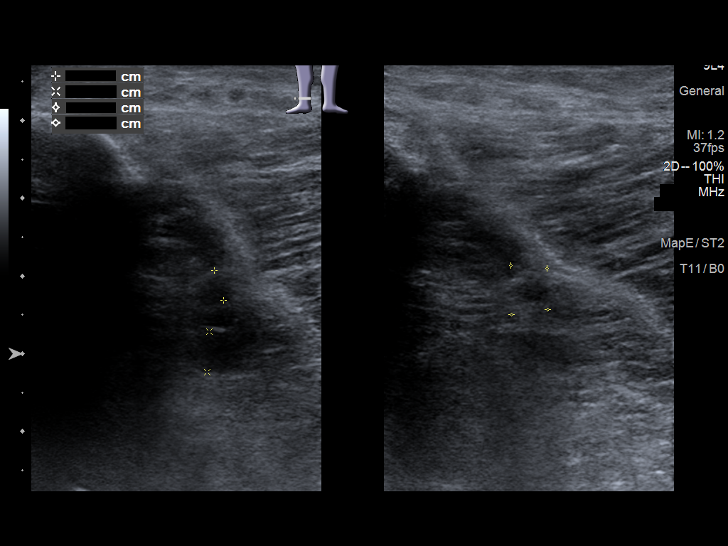
[im 34/34]
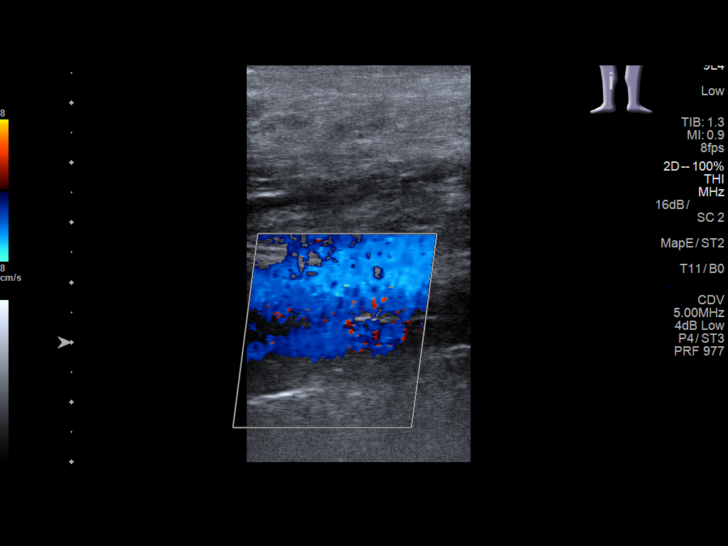

[13 of 24 positions shown; findings below may reference images not displayed]

FINDINGS: Contralateral Common Femoral Vein: Respiratory phasicity is normal
and symmetric with the symptomatic side. No evidence of thrombus.
Normal compressibility.

Common Femoral Vein: No evidence of thrombus. Normal
compressibility, respiratory phasicity and response to augmentation.

Saphenofemoral Junction: No evidence of thrombus. Normal
compressibility and flow on color Doppler imaging.

Profunda Femoral Vein: No evidence of thrombus. Normal
compressibility and flow on color Doppler imaging.

Femoral Vein: No evidence of thrombus. Normal compressibility,
respiratory phasicity and response to augmentation.

Popliteal Vein: No evidence of thrombus. Normal compressibility,
respiratory phasicity and response to augmentation.

Calf Veins: No evidence of thrombus. Normal compressibility and flow
on color Doppler imaging.

Superficial Great Saphenous Vein: No evidence of thrombus. Normal
compressibility.

Venous Reflux:  None.

Other Findings:  None.
IMPRESSION: No evidence of DVT within the right lower extremity.

## 2019-08-27 DIAGNOSIS — F17210 Nicotine dependence, cigarettes, uncomplicated: Secondary | ICD-10-CM | POA: Insufficient documentation

## 2019-09-16 DIAGNOSIS — E538 Deficiency of other specified B group vitamins: Secondary | ICD-10-CM | POA: Insufficient documentation

## 2019-09-16 DIAGNOSIS — E1165 Type 2 diabetes mellitus with hyperglycemia: Secondary | ICD-10-CM | POA: Diagnosis present

## 2020-12-20 DIAGNOSIS — D560 Alpha thalassemia: Secondary | ICD-10-CM | POA: Insufficient documentation

## 2020-12-29 ENCOUNTER — Other Ambulatory Visit: Payer: Self-pay

## 2020-12-29 ENCOUNTER — Emergency Department: Payer: Medicaid Other

## 2020-12-29 ENCOUNTER — Encounter: Payer: Self-pay | Admitting: Emergency Medicine

## 2020-12-29 ENCOUNTER — Inpatient Hospital Stay
Admission: EM | Admit: 2020-12-29 | Discharge: 2021-01-04 | DRG: 872 | Disposition: A | Discharge status: Home / Self Care | Payer: Medicaid Other | Attending: Internal Medicine | Admitting: Internal Medicine

## 2020-12-29 DIAGNOSIS — R112 Nausea with vomiting, unspecified: Secondary | ICD-10-CM

## 2020-12-29 DIAGNOSIS — Z79899 Other long term (current) drug therapy: Secondary | ICD-10-CM

## 2020-12-29 DIAGNOSIS — M797 Fibromyalgia: Secondary | ICD-10-CM | POA: Diagnosis present

## 2020-12-29 DIAGNOSIS — J449 Chronic obstructive pulmonary disease, unspecified: Secondary | ICD-10-CM | POA: Diagnosis present

## 2020-12-29 DIAGNOSIS — R1110 Vomiting, unspecified: Secondary | ICD-10-CM

## 2020-12-29 DIAGNOSIS — A419 Sepsis, unspecified organism: Secondary | ICD-10-CM | POA: Diagnosis not present

## 2020-12-29 DIAGNOSIS — E876 Hypokalemia: Secondary | ICD-10-CM | POA: Diagnosis not present

## 2020-12-29 DIAGNOSIS — G4733 Obstructive sleep apnea (adult) (pediatric): Secondary | ICD-10-CM | POA: Diagnosis present

## 2020-12-29 DIAGNOSIS — E538 Deficiency of other specified B group vitamins: Secondary | ICD-10-CM | POA: Diagnosis present

## 2020-12-29 DIAGNOSIS — N179 Acute kidney failure, unspecified: Secondary | ICD-10-CM

## 2020-12-29 DIAGNOSIS — R000 Tachycardia, unspecified: Secondary | ICD-10-CM | POA: Diagnosis not present

## 2020-12-29 DIAGNOSIS — K529 Noninfective gastroenteritis and colitis, unspecified: Secondary | ICD-10-CM | POA: Diagnosis present

## 2020-12-29 DIAGNOSIS — R109 Unspecified abdominal pain: Secondary | ICD-10-CM

## 2020-12-29 DIAGNOSIS — I952 Hypotension due to drugs: Secondary | ICD-10-CM | POA: Diagnosis present

## 2020-12-29 DIAGNOSIS — F319 Bipolar disorder, unspecified: Secondary | ICD-10-CM | POA: Diagnosis present

## 2020-12-29 DIAGNOSIS — R197 Diarrhea, unspecified: Secondary | ICD-10-CM | POA: Diagnosis not present

## 2020-12-29 DIAGNOSIS — B9689 Other specified bacterial agents as the cause of diseases classified elsewhere: Secondary | ICD-10-CM | POA: Diagnosis present

## 2020-12-29 DIAGNOSIS — B958 Unspecified staphylococcus as the cause of diseases classified elsewhere: Secondary | ICD-10-CM | POA: Diagnosis not present

## 2020-12-29 DIAGNOSIS — R7401 Elevation of levels of liver transaminase levels: Secondary | ICD-10-CM | POA: Diagnosis present

## 2020-12-29 DIAGNOSIS — E7800 Pure hypercholesterolemia, unspecified: Secondary | ICD-10-CM | POA: Diagnosis present

## 2020-12-29 DIAGNOSIS — R8290 Unspecified abnormal findings in urine: Secondary | ICD-10-CM | POA: Diagnosis present

## 2020-12-29 DIAGNOSIS — Z6841 Body Mass Index (BMI) 40.0 and over, adult: Secondary | ICD-10-CM

## 2020-12-29 DIAGNOSIS — M25569 Pain in unspecified knee: Secondary | ICD-10-CM | POA: Diagnosis present

## 2020-12-29 DIAGNOSIS — E1165 Type 2 diabetes mellitus with hyperglycemia: Secondary | ICD-10-CM | POA: Diagnosis present

## 2020-12-29 DIAGNOSIS — E785 Hyperlipidemia, unspecified: Secondary | ICD-10-CM | POA: Diagnosis present

## 2020-12-29 DIAGNOSIS — R6520 Severe sepsis without septic shock: Secondary | ICD-10-CM | POA: Diagnosis present

## 2020-12-29 DIAGNOSIS — T448X5A Adverse effect of centrally-acting and adrenergic-neuron-blocking agents, initial encounter: Secondary | ICD-10-CM | POA: Diagnosis present

## 2020-12-29 DIAGNOSIS — A021 Salmonella sepsis: Principal | ICD-10-CM | POA: Diagnosis present

## 2020-12-29 DIAGNOSIS — Z20822 Contact with and (suspected) exposure to covid-19: Secondary | ICD-10-CM | POA: Diagnosis present

## 2020-12-29 DIAGNOSIS — G8929 Other chronic pain: Secondary | ICD-10-CM | POA: Diagnosis present

## 2020-12-29 DIAGNOSIS — A020 Salmonella enteritis: Secondary | ICD-10-CM | POA: Diagnosis present

## 2020-12-29 DIAGNOSIS — R1084 Generalized abdominal pain: Secondary | ICD-10-CM | POA: Diagnosis not present

## 2020-12-29 DIAGNOSIS — F17210 Nicotine dependence, cigarettes, uncomplicated: Secondary | ICD-10-CM | POA: Diagnosis present

## 2020-12-29 DIAGNOSIS — R7881 Bacteremia: Secondary | ICD-10-CM | POA: Diagnosis not present

## 2020-12-29 DIAGNOSIS — I119 Hypertensive heart disease without heart failure: Secondary | ICD-10-CM | POA: Diagnosis present

## 2020-12-29 DIAGNOSIS — K760 Fatty (change of) liver, not elsewhere classified: Secondary | ICD-10-CM | POA: Diagnosis present

## 2020-12-29 DIAGNOSIS — E559 Vitamin D deficiency, unspecified: Secondary | ICD-10-CM | POA: Diagnosis present

## 2020-12-29 LAB — COMPREHENSIVE METABOLIC PANEL
ALT: 48 U/L — ABNORMAL HIGH (ref 0–44)
AST: 52 U/L — ABNORMAL HIGH (ref 15–41)
Albumin: 3.3 g/dL — ABNORMAL LOW (ref 3.5–5.0)
Alkaline Phosphatase: 71 U/L (ref 38–126)
Anion gap: 9 (ref 5–15)
BUN: 19 mg/dL (ref 6–20)
CO2: 19 mmol/L — ABNORMAL LOW (ref 22–32)
Calcium: 8.5 mg/dL — ABNORMAL LOW (ref 8.9–10.3)
Chloride: 102 mmol/L (ref 98–111)
Creatinine, Ser: 1.65 mg/dL — ABNORMAL HIGH (ref 0.44–1.00)
GFR, Estimated: 38 mL/min — ABNORMAL LOW (ref >60)
Glucose, Bld: 286 mg/dL — ABNORMAL HIGH (ref 70–99)
Potassium: 3.6 mmol/L (ref 3.5–5.1)
Sodium: 130 mmol/L — ABNORMAL LOW (ref 135–145)
Total Bilirubin: 0.9 mg/dL (ref 0.3–1.2)
Total Protein: 7.7 g/dL (ref 6.5–8.1)

## 2020-12-29 LAB — CBC
HCT: 44.4 % (ref 36.0–46.0)
Hemoglobin: 14.6 g/dL (ref 12.0–15.0)
MCH: 25.2 pg — ABNORMAL LOW (ref 26.0–34.0)
MCHC: 32.9 g/dL (ref 30.0–36.0)
MCV: 76.7 fL — ABNORMAL LOW (ref 80.0–100.0)
Platelets: 277 10*3/uL (ref 150–400)
RBC: 5.79 MIL/uL — ABNORMAL HIGH (ref 3.87–5.11)
RDW: 14.4 % (ref 11.5–15.5)
WBC: 9.5 10*3/uL (ref 4.0–10.5)
nRBC: 0 % (ref 0.0–0.2)

## 2020-12-29 LAB — LACTIC ACID, PLASMA
Lactic Acid, Venous: 3.8 mmol/L (ref 0.5–1.9)
Lactic Acid, Venous: 2.1 mmol/L (ref 0.5–1.9)

## 2020-12-29 LAB — CBG MONITORING, ED: Glucose-Capillary: 308 mg/dL — ABNORMAL HIGH (ref 70–99)

## 2020-12-29 LAB — TROPONIN I (HIGH SENSITIVITY)
Troponin I (High Sensitivity): 13 ng/L (ref <18)
Troponin I (High Sensitivity): 17 ng/L (ref <18)

## 2020-12-29 LAB — RESP PANEL BY RT-PCR (FLU A&B, COVID) ARPGX2
Influenza A by PCR: NEGATIVE
Influenza B by PCR: NEGATIVE
SARS Coronavirus 2 by RT PCR: NEGATIVE

## 2020-12-29 LAB — LIPASE, BLOOD: Lipase: 20 U/L (ref 11–51)

## 2020-12-29 MED ORDER — LABETALOL HCL 5 MG/ML IV SOLN
10.0000 mg | Freq: Once | INTRAVENOUS | Status: DC
  Administered 2020-12-29: 10 mg via INTRAVENOUS
  Filled 2020-12-29: qty 4

## 2020-12-29 MED ORDER — ACETAMINOPHEN 325 MG PO TABS
650.0000 mg | ORAL_TABLET | Freq: Once | ORAL | Status: AC
  Administered 2020-12-29: 650 mg via ORAL
  Filled 2020-12-29: qty 2

## 2020-12-29 MED ORDER — SODIUM CHLORIDE 0.9 % IV BOLUS
1000.0000 mL | Freq: Once | INTRAVENOUS | Status: AC
  Administered 2020-12-29: 1000 mL via INTRAVENOUS

## 2020-12-29 MED ORDER — METRONIDAZOLE 500 MG/100ML IV SOLN
500.0000 mg | Freq: Once | INTRAVENOUS | Status: AC
  Administered 2020-12-29: 500 mg via INTRAVENOUS
  Filled 2020-12-29: qty 100

## 2020-12-29 MED ORDER — CIPROFLOXACIN IN D5W 400 MG/200ML IV SOLN
400.0000 mg | Freq: Once | INTRAVENOUS | Status: AC
  Administered 2020-12-29: 400 mg via INTRAVENOUS
  Filled 2020-12-29: qty 200

## 2020-12-29 MED ORDER — ONDANSETRON HCL 4 MG/2ML IJ SOLN
4.0000 mg | Freq: Once | INTRAMUSCULAR | Status: AC
  Administered 2020-12-29: 4 mg via INTRAVENOUS
  Filled 2020-12-29: qty 2

## 2020-12-29 NOTE — H&P (Signed)
History and Physical        Hospital Admission Note Date: 12/29/2020  Patient name: Priscilla Moore Medical record number: [id number] Date of birth: July 06, 1976 Age: 45 y.o. Gender: female  PCP: [place], [facility]    Patient coming from:   I have reviewed all [facility] the [facility].    Chief Complaint:  Abdominal Pain, Diarrhea, Emesis   HPI: Priscilla Moore is 45 y.o. female with PMH of HLD, T2DM, fibromyalgia, Bipolar, and tachycardia who presents with 2 days of generalized abdominal pain, diarrhea, vomiting. She reports non-bloody/non-bilious emesis and watery, non-bloody diarrhea. Occurring multiple times per day. Has been unable to tolerate much PO intake. Abdominal pain is described as cramping. Patient denies any fevers, chills, or sweats. She is not endorsing any sick contacts, bad food intake, recent travel, or antibiotic use. No prior GI history of significance.    ED work-up/course:   DDX: colitis, c. Diff, viral gastroenteritis, diverticulitis     Patient presents to the ED for several days of nausea, vomiting, and diarrhea.  Patient presented to the ED initially normotensive and tachycardic in the 150s.  She was treated initially with a fluid bolus, and IV dose labetalol which resulted in some subsequent hypotension.  Patient's BP has trended up with IV fluid bolus.  White blood cell count is normal but elevated lactate at 3.8.  She was also found to have slight elevation in her creatinine at 1.65, and a slight elevation of her liver function enzymes.  No radiologic evidence of any acute infectious process on her chest x-ray, but her CT abdomen pelvis, did reveal some bowel wall thickening consistent with colitis.  Patient remained stable at this time.  She will be transferred to my attending at this time, and hospitalist will be consulted for admission for further sepsis management.     Review  of Systems: Positives marked in 'bold' Constitutional: Denies fever, chills, diaphoresis, poor appetite and fatigue.  HEENT: Denies photophobia, eye pain, redness, hearing loss, ear pain, congestion, sore throat, rhinorrhea, sneezing, mouth sores, trouble swallowing, neck pain, neck stiffness and tinnitus.   Respiratory: Denies SOB, DOE, cough, chest tightness,  and wheezing.   Cardiovascular: Denies chest pain, palpitations and leg swelling.  Gastrointestinal: Denies nausea, vomiting, abdominal pain, diarrhea, constipation, blood in stool and abdominal distention.  Genitourinary: Denies dysuria, urgency, frequency, hematuria, flank pain and difficulty urinating.  Musculoskeletal: Denies myalgias, back pain, joint swelling, arthralgias and gait problem.  Skin: Denies pallor, rash and wound.  Neurological: Denies dizziness, seizures, syncope, weakness, light-headedness, numbness and headaches.  Hematological: Denies adenopathy. Easy bruising, personal or family bleeding history  Psychiatric/Behavioral: Denies suicidal ideation, mood changes, confusion, nervousness, sleep disturbance and agitation  Past Medical History: Past Medical History:  Diagnosis Date   Fibromyalgia    High cholesterol    Irregular heart beat    Knee pain, chronic     Past Surgical History:  Procedure Laterality Date   CERVICAL DISCECTOMY      Medications: Prior to Admission medications   Medication Sig Start Date End Date Taking? Authorizing Provider  chlorpheniramine-HYDROcodone (TUSSIONEX PENNKINETIC ER) 10-8 MG/5ML SUER Take 5 mLs by mouth 2 (  two) times daily. 01/23/16   Irean Hong, MD  cyclobenzaprine (FLEXERIL) 5 MG tablet Take 1 tablet (5 mg total) by mouth 3 (three) times daily as needed for muscle spasms. 07/21/15   Menshew, Charlesetta Ivory, PA-C  gabapentin (NEURONTIN) 300 MG capsule Take 900 mg by mouth 3 (three) times daily.    [provider]  ibuprofen (ADVIL,MOTRIN) 800 MG tablet Take 1  tablet (800 mg total) by mouth every 8 (eight) hours as needed for mild pain or moderate pain (with food). 04/11/18   Rockne Menghini, MD  traMADol (ULTRAM) 50 MG tablet Take 1 tablet (50 mg total) by mouth 2 (two) times daily. 07/21/15   Menshew, Charlesetta Ivory, PA-C    Allergies:   Allergies  Allergen Reactions   Aripiprazole Other (See Comments)    Suicidal thoughts Suicidal thoughts     Social History:  reports that she has been smoking cigarettes. She has a 25.00 pack-year smoking history. She does not have any smokeless tobacco history on file. She reports current alcohol use. She reports that she does not use drugs.  Family History: History reviewed. No pertinent family history.  Physical Exam: Blood pressure 123/62, pulse (!) 125, temperature 99.8 F (37.7 C), temperature source Oral, resp. rate (!) 31, height 5\' 8"  (1.727 m), weight 131.6 kg, SpO2 96 %. General: Alert, awake, oriented x3, in no acute distress. Eyes: pink conjunctiva,anicteric sclera, pupils equal and reactive to light and accomodation, HEENT: normocephalic, atraumatic, oropharynx clear, mucus membranes dry  Neck: supple, no masses or lymphadenopathy, no goiter, no bruits, no JVD CVS: Tachycardic rate and regular rhythm, without murmurs, rubs or gallops. No lower extremity edema Resp : Clear to auscultation bilaterally, no wheezing, rales or rhonchi. GI : Soft, nontender, nondistended, positive bowel sounds, no masses. No hepatomegaly. No hernia.  Musculoskeletal: No clubbing or cyanosis, positive pedal pulses. No contracture. ROM intact  Neuro: Grossly intact, no focal neurological deficits, strength 5/5 upper and lower extremities bilaterally Psych: alert and oriented x 3, normal mood and affect Skin: no rashes or lesions, warm and dry   LABS on Admission: I have personally reviewed all the labs and imagings below    Basic Metabolic Panel: Recent Labs  Lab 12/29/20 1859  NA 130*  K 3.6  CL  102  CO2 19*  GLUCOSE 286*  BUN 19  CREATININE 1.65*  CALCIUM 8.5*   Liver Function Tests: Recent Labs  Lab 12/29/20 1859  AST 52*  ALT 48*  ALKPHOS 71  BILITOT 0.9  PROT 7.7  ALBUMIN 3.3*   Recent Labs  Lab 12/29/20 1859  LIPASE 20   No results for input(s): AMMONIA in the last 168 hours. CBC: Recent Labs  Lab 12/29/20 1733  WBC 9.5  HGB 14.6  HCT 44.4  MCV 76.7*  PLT 277   Cardiac Enzymes: No results for input(s): CKTOTAL, CKMB, CKMBINDEX, TROPONINI in the last 168 hours. BNP: Invalid input(s): POCBNP CBG: Recent Labs  Lab 12/29/20 1825  GLUCAP 308*    Radiological Exams on Admission:  CT ABDOMEN PELVIS WO CONTRAST  Result Date: 12/29/2020 CLINICAL DATA:  Abdominal pain, vomiting and diarrhea for 2 days. Diverticulitis suspected. EXAM: CT ABDOMEN AND PELVIS WITHOUT CONTRAST TECHNIQUE: Multidetector CT imaging of the abdomen and pelvis was performed following the standard protocol without IV contrast. COMPARISON:  None. FINDINGS: Lower chest: No acute airspace disease or pleural effusion. Hepatobiliary: The liver is enlarged spanning 20 cm cranial caudal. Advanced hepatic steatosis. There is no  discrete focal lesion. High-density material in the gallbladder may represent sludge or noncalcified stones. No pericholecystic inflammation or abnormal distention. No common bile duct dilatation. Pancreas: No ductal dilatation or inflammation. Spleen: Normal in size without focal abnormality. Splenule anteriorly. Adrenals/Urinary Tract: Normal adrenal glands. No hydronephrosis or renal calculi. No perinephric edema. No evidence of focal renal lesion on this noncontrast exam. Partially distended urinary bladder. No bladder wall thickening. Stomach/Bowel: Patulous distal esophagus. Nondistended stomach. Tiny intramural lipoma in the duodenum, nonobstructing and of no clinical significance. Decompressed proximal small bowel. Distal small bowel loops are fluid-filled and mildly  prominent, no transition point. Normal appendix. Diffuse pericolonic fat stranding and liquid stool throughout the colon. Wall thickening involving the cecum and proximal ascending colon. No bowel pneumatosis. No significant diverticular disease. Vascular/Lymphatic: Small ileocolic lymph nodes are likely reactive, but nonspecific. There is scattered central mesenteric and retroperitoneal nodes, not enlarged by size criteria. Normal caliber abdominal aorta. No portal venous or mesenteric gas. Reproductive: Uterus and bilateral adnexa are unremarkable. Other: No ascites or free air. No focal fluid collection. Small fat containing umbilical hernia. Musculoskeletal: There are no acute or suspicious osseous abnormalities. IMPRESSION: 1. Diffuse pericolonic fat stranding and liquid stool throughout the colon consistent with colitis. Wall thickening involving the cecum and proximal ascending colon 2. Hepatomegaly and advanced hepatic steatosis. 3. High-density material in the gallbladder may represent sludge or noncalcified stones. No pericholecystic inflammation or abnormal distention. Electronically Signed   By: Narda Rutherford M.D.   On: 12/29/2020 20:05   DG Chest Port 1 View  Result Date: 12/29/2020 CLINICAL DATA:  Pt comes into the ED via POV c.o generalized abd pain, emesis, and diarrhea x 2 days. Pt in NAD at this time with even and unlabored respirations.pt tachycardic in ER roomtachycardia EXAM: PORTABLE CHEST 1 VIEW COMPARISON:  None. FINDINGS: Normal mediastinum and cardiac silhouette. Normal pulmonary vasculature. No evidence of effusion, infiltrate, or pneumothorax. No acute bony abnormality. Anterior cervical fusion. IMPRESSION: No acute cardiopulmonary process. Electronically Signed   By: Genevive Bi M.D.   On: 12/29/2020 18:55      EKG: Independently reviewed. Sinus tachycardia.    Assessment/Plan Active Problems:   Bipolar 1 disorder (HCC)   Type 2 diabetes mellitus with  hyperglycemia, without long-term current use of insulin (HCC)   Fibromyalgia   Colitis   Sepsis (HCC)   Abdominal pain, vomiting, and diarrhea   AKI (acute kidney injury) (HCC)   Dyslipidemia   COPD (chronic obstructive pulmonary disease) (HCC)  Sepsis with Suspected Source of Colitis  Patient presenting with 2 days of generalized abdominal pain, vomiting, diarrhea. Found to be tachycardic at admission. Also with tachypnea. Was given Labetalol by ED provider and ended up with hypotension. Has been able to recover with fluid resuscitation. Total 2L NS given. Lactic acid 3.8. No leukocytosis. Lipase normal. CT abdomen and pelvis: Diffuse pericolonic fat stranding and liquid stool throughout the colon consistent with colitis. Wall thickening involving the cecum and proximal ascending colon. Given Cipro and Flagyl to cover for intraabdominal infection. CXR was neg.  -admit to SDU, telemetry  -continue IV Cipro and Flagyl  -Zofran prn  -collect C diff and gastrointestinal pcr panel  -trend lactic acid  -continue IVF with NS at 125 cc/hr  -blood cultures collected  -urine studies pending  -monitor CBC  -tylenol prn fevers    AKI  Cr 1.65, baseline appears to be around 1.1. Suspect prerenal from gastrointestinal losses.  -IVF as above  -avoid nephrotoxic agents  -  BMET in AM  -hold Losartan   History of Tachycardia  Listed on problem list and has low dose Metoprolol prescribed. EKG with sinus tachycardia.  -hold beta blocker in setting of tachycardia  -further investigation of this per rounding team   T2DM  Uncontrolled. A1c 9.8 on 6/6. Glucose 300 at admit.  -continue Victoza 1.8 mg  -start moderate SSI  -CBGs AC/HS   Fibromyalgia  -continue Cymbalta   COPD  No evidence of acute exacerbation.  -continue Dulera and Spiriva   Hepatic Steatosis  Noted on CT abdomen. From review, has had recently elevated liver enzymes at outpatient visit likely related to this. AST 52 and ALT  48 at admit.  -PT/INR -will need education around this for outpatient management such as weight loss, appropriate diet, etc    DVT prophylaxis: Lovenox   CODE STATUS: Full   Consults called: None   Admission status: Inpatient   The medical decision making on this patient was of high complexity and the patient is at high risk for clinical deterioration, therefore this is a level 3 admission.  Severity of Illness:      The appropriate patient status for this patient is INPATIENT. Inpatient status is judged to be reasonable and necessary in order to provide the required intensity of service to ensure the patient's safety. The patient's presenting symptoms, physical exam findings, and initial radiographic and laboratory data in the context of their chronic comorbidities is felt to place them at high risk for further clinical deterioration. Furthermore, it is not anticipated that the patient will be medically stable for discharge from the hospital within 2 midnights of admission. The following factors support the patient status of inpatient.   " The patient's presenting symptoms include abdominal pain, diarrhea, emesis. " The worrisome physical exam findings include tachycardia, tachypnea. " The initial radiographic and laboratory data are worrisome because of lactic acid 3.8, Cr 1.6, CT abdomen with evidence of colitis. " The chronic co-morbidities include T2DM, COPD, fibromyalgia, bipolar disorder.   * I certify that at the point of admission it is my clinical judgment that the patient will require inpatient hospital care spanning beyond 2 midnights from the point of admission due to high intensity of service, high risk for further deterioration and high frequency of surveillance required.*   Time Spent on Admission: 55 minutes      De Hollingshead D.O.  [facility] 12/29/2020, 10:31 PM

## 2020-12-29 NOTE — ED Triage Notes (Signed)
Pt comes into the ED via POV c.o generalized abd pain, emesis, and diarrhea x 2 days.  Pt in NAD at this time with even and unlabored respirations.

## 2020-12-29 NOTE — ED Notes (Signed)
Pt on toilet; pt unstable on feet. Noticed pt still had brief on with diarrhea on it, pt took it off and then laid in bed naked. This tech cleansed pt while she was laying in bed. Clean brief and chux applied. Pt placed back on card monitor. New sheet placed on pt for comfort. Lights dimmed off per request.

## 2020-12-29 NOTE — ED Notes (Signed)
Priscilla Moore, patient's sister provided with an updated on patient's condition.

## 2020-12-29 NOTE — ED Notes (Signed)
Lactic 3.8 reported to EDP Roxan Hockey

## 2020-12-29 NOTE — ED Notes (Signed)
Patient's sister, French Ana [phone]). Please call with info and updates.

## 2020-12-29 NOTE — ED Provider Notes (Signed)
[facility] Emergency Department Provider Note  ____________________________________________   Event Date/Time   First MD Initiated Contact with Patient 12/29/20 1752     (approximate)  I have reviewed the triage vital signs and the nursing notes.   HISTORY  Chief Complaint Abdominal Pain, Emesis, and Diarrhea  HPI Priscilla Moore is a 45 y.o. female with the below noted medical history, including DM, tachycardia, and fibromyalgia, presents herself to the ED for evaluation of 2 days of generalized abdominal pain with associated emesis and diarrhea.  She reports non-bloody/non-bilious emesis and watery, non-bloody diarrhea. Patient denies any fevers, chills, or sweats.  Denies any chest pain, or shortness of breath.  She is not endorsing any sick contacts, bad food intake, recent travel, or other high risk exposures.  Past Medical History:  Diagnosis Date   Fibromyalgia    High cholesterol    Irregular heart beat    Knee pain, chronic     There are no problems to display for this patient.   Past Surgical History:  Procedure Laterality Date   CERVICAL DISCECTOMY      Prior to Admission medications   Medication Sig Start Date End Date Taking? Authorizing Provider  chlorpheniramine-HYDROcodone (TUSSIONEX PENNKINETIC ER) 10-8 MG/5ML SUER Take 5 mLs by mouth 2 (two) times daily. 01/23/16   Irean Hong, MD  cyclobenzaprine (FLEXERIL) 5 MG tablet Take 1 tablet (5 mg total) by mouth 3 (three) times daily as needed for muscle spasms. 07/21/15   Yuval Rubens, Charlesetta Ivory, PA-C  gabapentin (NEURONTIN) 300 MG capsule Take 900 mg by mouth 3 (three) times daily.    [provider]  ibuprofen (ADVIL,MOTRIN) 800 MG tablet Take 1 tablet (800 mg total) by mouth every 8 (eight) hours as needed for mild pain or moderate pain (with food). 04/11/18   Rockne Menghini, MD  traMADol (ULTRAM) 50 MG tablet Take 1 tablet (50 mg total) by mouth 2 (two) times daily.  07/21/15   Zariyah Stephens, Charlesetta Ivory, PA-C    Allergies Patient has no known allergies.  History reviewed. No pertinent family history.  Social History Social History   Tobacco Use   Smoking status: Every Day    Packs/day: 1.00    Years: 25.00    Pack years: 25.00    Types: Cigarettes  Substance Use Topics   Alcohol use: Yes    Comment: social drinker   Drug use: No    Review of Systems  Constitutional: No fever/chills Eyes: No visual changes. ENT: No sore throat. Cardiovascular: Denies chest pain. Respiratory: Denies shortness of breath. Gastrointestinal: Reports generalized abdominal pain.  No nausea, reports vomiting and diarrhea.  No constipation. Genitourinary: Negative for dysuria. Musculoskeletal: Negative for back pain. Skin: Negative for rash. Neurological: Negative for headaches, focal weakness or numbness. ____________________________________________   PHYSICAL EXAM:  VITAL SIGNS: ED Triage Vitals  Enc Vitals Group     BP 12/29/20 1737 120/75     Pulse Rate 12/29/20 1737 (!) 155     Resp 12/29/20 1737 20     Temp 12/29/20 1737 99.8 F (37.7 C)     Temp Source 12/29/20 1737 Oral     SpO2 12/29/20 1737 97 %     Weight 12/29/20 1730 268 lb (121.6 kg)     Height 12/29/20 1730 5\' 8"  (1.727 m)     Head Circumference --      Peak Flow --      Pain Score 12/29/20 1730 7  Pain Loc --      Pain Edu? --      Excl. in GC? --     Constitutional: Alert and oriented. Well appearing and in no acute distress. Eyes: Conjunctivae are normal. PERRL. EOMI. Head: Atraumatic. Nose: No congestion/rhinnorhea. Mouth/Throat: Mucous membranes are moist.  Oropharynx non-erythematous. Neck: No stridor.   Cardiovascular: Normal rate, regular rhythm. Grossly normal heart sounds.  Good peripheral circulation. Respiratory: Normal respiratory effort.  No retractions. Lungs CTAB. Gastrointestinal: Soft and nontender. No distention. No abdominal bruits. No CVA  tenderness. Musculoskeletal: No lower extremity tenderness nor edema.  No joint effusions. Neurologic:  Normal speech and language. No gross focal neurologic deficits are appreciated. No gait instability. Skin:  Skin is warm, dry and intact. No rash noted. Psychiatric: Mood and affect are normal. Speech and behavior are normal.  ____________________________________________   LABS (all labs ordered are listed, but only abnormal results are displayed)  Labs Reviewed  CBC - Abnormal; Notable for the following components:      Result Value   RBC 5.79 (*)    MCV 76.7 (*)    MCH 25.2 (*)    All other components within normal limits  LACTIC ACID, PLASMA - Abnormal; Notable for the following components:   Lactic Acid, Venous 3.8 (*)    All other components within normal limits  COMPREHENSIVE METABOLIC PANEL - Abnormal; Notable for the following components:   Sodium 130 (*)    CO2 19 (*)    Glucose, Bld 286 (*)    Creatinine, Ser 1.65 (*)    Calcium 8.5 (*)    Albumin 3.3 (*)    AST 52 (*)    ALT 48 (*)    GFR, Estimated 38 (*)    All other components within normal limits  CBG MONITORING, ED - Abnormal; Notable for the following components:   Glucose-Capillary 308 (*)    All other components within normal limits  RESP PANEL BY RT-PCR (FLU A&B, COVID) ARPGX2  LIPASE, BLOOD  URINALYSIS, COMPLETE (UACMP) WITH MICROSCOPIC  LACTIC ACID, PLASMA  POC URINE PREG, ED  TROPONIN I (HIGH SENSITIVITY)  TROPONIN I (HIGH SENSITIVITY)   ____________________________________________  EKG  Vent. rate 153 BPM PR interval 112 ms QRS duration 68 ms QT/QTcB 320/510 ms P-R-T axes * 85 62 No STEMI ____________________________________________  RADIOLOGY I, Lissa Hoard, personally viewed and evaluated these images (plain radiographs) as part of my medical decision making, as well as reviewing the written report by the radiologist.  ED MD interpretation:  agree with report  Official  radiology report(s): CT ABDOMEN PELVIS WO CONTRAST  Result Date: 12/29/2020 CLINICAL DATA:  Abdominal pain, vomiting and diarrhea for 2 days. Diverticulitis suspected. EXAM: CT ABDOMEN AND PELVIS WITHOUT CONTRAST TECHNIQUE: Multidetector CT imaging of the abdomen and pelvis was performed following the standard protocol without IV contrast. COMPARISON:  None. FINDINGS: Lower chest: No acute airspace disease or pleural effusion. Hepatobiliary: The liver is enlarged spanning 20 cm cranial caudal. Advanced hepatic steatosis. There is no discrete focal lesion. High-density material in the gallbladder may represent sludge or noncalcified stones. No pericholecystic inflammation or abnormal distention. No common bile duct dilatation. Pancreas: No ductal dilatation or inflammation. Spleen: Normal in size without focal abnormality. Splenule anteriorly. Adrenals/Urinary Tract: Normal adrenal glands. No hydronephrosis or renal calculi. No perinephric edema. No evidence of focal renal lesion on this noncontrast exam. Partially distended urinary bladder. No bladder wall thickening. Stomach/Bowel: Patulous distal esophagus. Nondistended stomach. Tiny intramural lipoma in the  duodenum, nonobstructing and of no clinical significance. Decompressed proximal small bowel. Distal small bowel loops are fluid-filled and mildly prominent, no transition point. Normal appendix. Diffuse pericolonic fat stranding and liquid stool throughout the colon. Wall thickening involving the cecum and proximal ascending colon. No bowel pneumatosis. No significant diverticular disease. Vascular/Lymphatic: Small ileocolic lymph nodes are likely reactive, but nonspecific. There is scattered central mesenteric and retroperitoneal nodes, not enlarged by size criteria. Normal caliber abdominal aorta. No portal venous or mesenteric gas. Reproductive: Uterus and bilateral adnexa are unremarkable. Other: No ascites or free air. No focal fluid collection. Small  fat containing umbilical hernia. Musculoskeletal: There are no acute or suspicious osseous abnormalities. IMPRESSION: 1. Diffuse pericolonic fat stranding and liquid stool throughout the colon consistent with colitis. Wall thickening involving the cecum and proximal ascending colon 2. Hepatomegaly and advanced hepatic steatosis. 3. High-density material in the gallbladder may represent sludge or noncalcified stones. No pericholecystic inflammation or abnormal distention. Electronically Signed   By: Narda Rutherford M.D.   On: 12/29/2020 20:05   DG Chest Port 1 View  Result Date: 12/29/2020 CLINICAL DATA:  Pt comes into the ED via POV c.o generalized abd pain, emesis, and diarrhea x 2 days. Pt in NAD at this time with even and unlabored respirations.pt tachycardic in ER roomtachycardia EXAM: PORTABLE CHEST 1 VIEW COMPARISON:  None. FINDINGS: Normal mediastinum and cardiac silhouette. Normal pulmonary vasculature. No evidence of effusion, infiltrate, or pneumothorax. No acute bony abnormality. Anterior cervical fusion. IMPRESSION: No acute cardiopulmonary process. Electronically Signed   By: Genevive Bi M.D.   On: 12/29/2020 18:55    ____________________________________________   PROCEDURES  Procedure(s) performed (including Critical Care):  Procedures   ____________________________________________   INITIAL IMPRESSION / ASSESSMENT AND PLAN / ED COURSE  As part of my medical decision making, I reviewed the following data within the electronic MEDICAL RECORD NUMBER Labs reviewed critical lactic, EKG interpreted tachycardia, Radiograph reviewed as above, and Notes from prior ED visits  DDX: colitis, c. Diff, viral gastroenteritis, diverticulitis    Patient presents to the ED for several days of nausea, vomiting, and diarrhea.  Patient presented to the ED initially normotensive and tachycardic in the 150s.  She was treated initially with a fluid bolus, and IV dose labetalol which resulted in  some subsequent hypotension.  Patient's BP has trended up with IV fluid bolus.  White blood cell count is normal but elevated lactate at 3.8.  She was also found to have slight elevation in her creatinine at 1.65, and a slight elevation of her liver function enzymes.  No radiologic evidence of any acute infectious process on her chest x-ray, but her CT abdomen pelvis, did reveal some bowel wall thickening consistent with colitis.  Patient remained stable at this time.  She will be transferred to my attending at this time, and hospitalist will be consulted for admission for further sepsis management.   ____________________________________________   FINAL CLINICAL IMPRESSION(S) / ED DIAGNOSES  Final diagnoses:  Nausea vomiting and diarrhea  Generalized abdominal pain  Sepsis, due to unspecified organism, unspecified whether acute organ dysfunction present [facility])     ED Discharge Orders     None        Note:  This document was prepared using Dragon voice recognition software and may include unintentional dictation errors.    Lissa Hoard, PA-C 12/29/20 2026    Sharyn Creamer, MD 12/30/20 [facility]

## 2020-12-29 NOTE — ED Notes (Signed)
Patient's brother, Kendell Bane [phone]).

## 2020-12-29 NOTE — ED Notes (Signed)
Lab called to check on trop.  It had not been ran, is currently running at this time

## 2020-12-30 ENCOUNTER — Encounter: Payer: Self-pay | Admitting: Internal Medicine

## 2020-12-30 DIAGNOSIS — R6520 Severe sepsis without septic shock: Secondary | ICD-10-CM

## 2020-12-30 DIAGNOSIS — A020 Salmonella enteritis: Secondary | ICD-10-CM

## 2020-12-30 LAB — BLOOD CULTURE ID PANEL (REFLEXED) - BCID2

## 2020-12-30 LAB — GLUCOSE, CAPILLARY
Glucose-Capillary: 141 mg/dL — ABNORMAL HIGH (ref 70–99)
Glucose-Capillary: 142 mg/dL — ABNORMAL HIGH (ref 70–99)

## 2020-12-30 LAB — GASTROINTESTINAL PANEL BY PCR, STOOL (REPLACES STOOL CULTURE)

## 2020-12-30 LAB — CBG MONITORING, ED
Glucose-Capillary: 204 mg/dL — ABNORMAL HIGH (ref 70–99)
Glucose-Capillary: 204 mg/dL — ABNORMAL HIGH (ref 70–99)

## 2020-12-30 LAB — URINALYSIS, COMPLETE (UACMP) WITH MICROSCOPIC
Bilirubin Urine: NEGATIVE
Glucose, UA: 50 mg/dL — AB
Ketones, ur: 5 mg/dL — AB
Nitrite: NEGATIVE
Protein, ur: 100 mg/dL — AB
Specific Gravity, Urine: 1.031 — ABNORMAL HIGH (ref 1.005–1.030)
pH: 5 (ref 5.0–8.0)

## 2020-12-30 LAB — BASIC METABOLIC PANEL
Anion gap: 7 (ref 5–15)
BUN: 15 mg/dL (ref 6–20)
CO2: 20 mmol/L — ABNORMAL LOW (ref 22–32)
Calcium: 7.7 mg/dL — ABNORMAL LOW (ref 8.9–10.3)
Chloride: 104 mmol/L (ref 98–111)
Creatinine, Ser: 1.15 mg/dL — ABNORMAL HIGH (ref 0.44–1.00)
GFR, Estimated: 59 mL/min — ABNORMAL LOW (ref >60)
Glucose, Bld: 189 mg/dL — ABNORMAL HIGH (ref 70–99)
Potassium: 3.5 mmol/L (ref 3.5–5.1)
Sodium: 131 mmol/L — ABNORMAL LOW (ref 135–145)

## 2020-12-30 LAB — CBC
HCT: 38.7 % (ref 36.0–46.0)
Hemoglobin: 12.5 g/dL (ref 12.0–15.0)
MCH: 24.7 pg — ABNORMAL LOW (ref 26.0–34.0)
MCHC: 32.3 g/dL (ref 30.0–36.0)
MCV: 76.5 fL — ABNORMAL LOW (ref 80.0–100.0)
Platelets: 208 10*3/uL (ref 150–400)
RBC: 5.06 MIL/uL (ref 3.87–5.11)
RDW: 14.1 % (ref 11.5–15.5)
WBC: 6.5 10*3/uL (ref 4.0–10.5)
nRBC: 0 % (ref 0.0–0.2)

## 2020-12-30 LAB — HIV ANTIBODY (ROUTINE TESTING W REFLEX): HIV Screen 4th Generation wRfx: NONREACTIVE

## 2020-12-30 LAB — C DIFFICILE QUICK SCREEN W PCR REFLEX
C Diff antigen: NEGATIVE
C Diff interpretation: NOT DETECTED
C Diff toxin: NEGATIVE

## 2020-12-30 LAB — PROTIME-INR
INR: 1.3 — ABNORMAL HIGH (ref 0.8–1.2)
Prothrombin Time: 15.7 seconds — ABNORMAL HIGH (ref 11.4–15.2)

## 2020-12-30 MED ORDER — VANCOMYCIN HCL 1000 MG/200ML IV SOLN
1000.0000 mg | Freq: Once | INTRAVENOUS | Status: DC
  Filled 2020-12-30: qty 200

## 2020-12-30 MED ORDER — INSULIN GLARGINE 100 UNIT/ML ~~LOC~~ SOLN
10.0000 [IU] | Freq: Every day | SUBCUTANEOUS | Status: DC
  Administered 2020-12-30 – 2021-01-04 (×6): 10 [IU] via SUBCUTANEOUS
  Filled 2020-12-30 (×7): qty 0.1

## 2020-12-30 MED ORDER — VANCOMYCIN HCL 10 G IV SOLR: 2500.0000 mg | Freq: Once | INTRAVENOUS | Status: DC

## 2020-12-30 MED ORDER — INSULIN ASPART 100 UNIT/ML IJ SOLN
0.0000 [IU] | Freq: Three times a day (TID) | INTRAMUSCULAR | Status: DC
  Administered 2020-12-30: 5 [IU] via SUBCUTANEOUS
  Administered 2020-12-30: 2 [IU] via SUBCUTANEOUS
  Administered 2020-12-30: 5 [IU] via SUBCUTANEOUS
  Administered 2020-12-31 (×2): 3 [IU] via SUBCUTANEOUS
  Administered 2020-12-31: 2 [IU] via SUBCUTANEOUS
  Administered 2021-01-01 (×2): 3 [IU] via SUBCUTANEOUS
  Administered 2021-01-01 – 2021-01-04 (×7): 2 [IU] via SUBCUTANEOUS
  Filled 2020-12-30 (×15): qty 1

## 2020-12-30 MED ORDER — POTASSIUM CHLORIDE IN NACL 20-0.9 MEQ/L-% IV SOLN
INTRAVENOUS | Status: DC
  Filled 2020-12-30 (×10): qty 1000

## 2020-12-30 MED ORDER — TIOTROPIUM BROMIDE MONOHYDRATE 18 MCG IN CAPS
18.0000 ug | ORAL_CAPSULE | Freq: Every day | RESPIRATORY_TRACT | Status: DC
  Administered 2021-01-02: 18 ug via RESPIRATORY_TRACT
  Filled 2020-12-30: qty 5

## 2020-12-30 MED ORDER — VANCOMYCIN HCL 1750 MG/350ML IV SOLN
1750.0000 mg | INTRAVENOUS | Status: DC
  Filled 2020-12-30: qty 350

## 2020-12-30 MED ORDER — CIPROFLOXACIN IN D5W 400 MG/200ML IV SOLN
400.0000 mg | Freq: Two times a day (BID) | INTRAVENOUS | Status: DC
  Administered 2020-12-30 – 2021-01-03 (×10): 400 mg via INTRAVENOUS
  Filled 2020-12-30 (×12): qty 200

## 2020-12-30 MED ORDER — PANTOPRAZOLE SODIUM 40 MG PO TBEC
40.0000 mg | DELAYED_RELEASE_TABLET | Freq: Every day | ORAL | Status: DC
  Administered 2020-12-30 – 2021-01-04 (×6): 40 mg via ORAL
  Filled 2020-12-30 (×6): qty 1

## 2020-12-30 MED ORDER — LIRAGLUTIDE 18 MG/3ML ~~LOC~~ SOPN: 1.8000 mg | PEN_INJECTOR | Freq: Every day | SUBCUTANEOUS | Status: DC

## 2020-12-30 MED ORDER — ACETAMINOPHEN 650 MG RE SUPP: 650.0000 mg | Freq: Four times a day (QID) | RECTAL | Status: DC | PRN

## 2020-12-30 MED ORDER — ONDANSETRON HCL 4 MG PO TABS
4.0000 mg | ORAL_TABLET | Freq: Four times a day (QID) | ORAL | Status: DC | PRN
  Filled 2020-12-30: qty 1

## 2020-12-30 MED ORDER — VANCOMYCIN HCL 1500 MG/300ML IV SOLN
1500.0000 mg | Freq: Once | INTRAVENOUS | Status: AC
  Administered 2020-12-30: 1500 mg via INTRAVENOUS
  Filled 2020-12-30: qty 300

## 2020-12-30 MED ORDER — TRAZODONE HCL 50 MG PO TABS
50.0000 mg | ORAL_TABLET | Freq: Every day | ORAL | Status: DC
  Administered 2020-12-30 – 2021-01-03 (×6): 50 mg via ORAL
  Filled 2020-12-30 (×6): qty 1

## 2020-12-30 MED ORDER — VANCOMYCIN HCL 1750 MG/350ML IV SOLN: 1750.0000 mg | INTRAVENOUS | Status: DC

## 2020-12-30 MED ORDER — ACETAMINOPHEN 325 MG PO TABS
650.0000 mg | ORAL_TABLET | Freq: Four times a day (QID) | ORAL | Status: DC | PRN
  Administered 2020-12-30 – 2021-01-04 (×6): 650 mg via ORAL
  Filled 2020-12-30 (×6): qty 2

## 2020-12-30 MED ORDER — METRONIDAZOLE 500 MG/100ML IV SOLN
500.0000 mg | Freq: Three times a day (TID) | INTRAVENOUS | Status: DC
  Administered 2020-12-30 (×2): 500 mg via INTRAVENOUS
  Filled 2020-12-30 (×5): qty 100

## 2020-12-30 MED ORDER — ONDANSETRON HCL 4 MG/2ML IJ SOLN
4.0000 mg | Freq: Four times a day (QID) | INTRAMUSCULAR | Status: DC | PRN
  Administered 2020-12-30 – 2021-01-02 (×5): 4 mg via INTRAVENOUS
  Filled 2020-12-30 (×5): qty 2

## 2020-12-30 MED ORDER — VANCOMYCIN HCL 1500 MG/300ML IV SOLN
1500.0000 mg | Freq: Once | INTRAVENOUS | Status: DC
  Filled 2020-12-30: qty 300

## 2020-12-30 MED ORDER — DULOXETINE HCL 20 MG PO CPEP
20.0000 mg | ORAL_CAPSULE | Freq: Two times a day (BID) | ORAL | Status: DC
  Administered 2020-12-30 – 2021-01-04 (×9): 20 mg via ORAL
  Filled 2020-12-30 (×14): qty 1

## 2020-12-30 MED ORDER — SODIUM CHLORIDE 0.9 % IV SOLN: INTRAVENOUS | Status: DC

## 2020-12-30 MED ORDER — ENOXAPARIN SODIUM 80 MG/0.8ML IJ SOSY
0.5000 mg/kg | PREFILLED_SYRINGE | INTRAMUSCULAR | Status: DC
  Administered 2020-12-30 – 2021-01-04 (×6): 65 mg via SUBCUTANEOUS
  Filled 2020-12-30 (×7): qty 0.65

## 2020-12-30 MED ORDER — GABAPENTIN 300 MG PO CAPS
900.0000 mg | ORAL_CAPSULE | Freq: Three times a day (TID) | ORAL | Status: DC
  Administered 2020-12-30 – 2021-01-04 (×16): 900 mg via ORAL
  Filled 2020-12-30 (×8): qty 3
  Filled 2020-12-30: qty 9
  Filled 2020-12-30 (×7): qty 3

## 2020-12-30 MED ORDER — VANCOMYCIN HCL IN DEXTROSE 1-5 GM/200ML-% IV SOLN
1000.0000 mg | Freq: Once | INTRAVENOUS | Status: DC
  Filled 2020-12-30: qty 200

## 2020-12-30 MED ORDER — VANCOMYCIN HCL IN DEXTROSE 1-5 GM/200ML-% IV SOLN
1000.0000 mg | Freq: Once | INTRAVENOUS | Status: AC
  Administered 2020-12-30: 1000 mg via INTRAVENOUS
  Filled 2020-12-30: qty 200

## 2020-12-30 MED ORDER — MOMETASONE FURO-FORMOTEROL FUM 200-5 MCG/ACT IN AERO
2.0000 | INHALATION_SPRAY | Freq: Two times a day (BID) | RESPIRATORY_TRACT | Status: DC
  Administered 2021-01-02: 2 via RESPIRATORY_TRACT
  Filled 2020-12-30: qty 8.8

## 2020-12-30 MED ORDER — MORPHINE SULFATE (PF) 4 MG/ML IV SOLN
4.0000 mg | INTRAVENOUS | Status: DC | PRN
  Administered 2020-12-30 – 2021-01-02 (×15): 4 mg via INTRAVENOUS
  Filled 2020-12-30 (×16): qty 1

## 2020-12-30 NOTE — ED Notes (Signed)
Pt is able to ambulate to toilet herself. She disconnects from the monitor when she has to have an episode of diarrhea. Then puts herself back in bed.

## 2020-12-30 NOTE — Consult Note (Signed)
NAME: Priscilla Moore  DOB: Mar 22, 1976  MRN: [id number]  Date/Time: 12/30/2020 4:57 PM  REQUESTING PROVIDER: Garrel Ridgelr.Ayiku Subjective:  REASON FOR CONSULT: staphylococcal bacteremia ? Priscilla Moore is a 45 y.o. female with a history of DM , B12 def, OSA, fibromyalgia  presents with abdominal pain, diarrhea and vomiting of 2days duration. As per patient there was  cook out on Sunday at [employer] [facility]- She ate fish. The next day afternoon she started with vomiting and then diarrhea, abdominal pain and fever /chills- as per patient no one else who attended the cook out became sick,  Vitals in the ED temp 99.8, BP 115/69, HR 115 and sats 99% Labs revealed WBC of 9.5, HB 14.6, PLT 277 Cr 1.65 and Na 130, lactate 3.8 CT abdomen showed Patulous distal esophagus Distal small bowel loops are fluid-filled and mildly prominent, no transition point.Normal appendix. Diffuse pericolonic fat stranding and liquid stool throughout the colon. Wall thickening involving the cecum and proximal ascending colon. No bowel pneumatosis. No significant diverticular disease. Blood culture sent- was started on cipro and flagyl Stool GI pcr was positive for salmonella. I am seeing the patient as blood culture3/4 bottle staph species   PMH T2 DM   Bipolar disorder B12 deficiency Hepatic steatosis with abnormal transaminases OSA Vitamin D def Fibromyalgia Chronic knee pain Post covid syndrome-    Past Surgical History:  Procedure Laterality Date   CERVICAL DISCECTOMY      Social History   Socioeconomic History   Marital status: Single    Spouse name: Not on file   Number of children: Not on file   Years of education: Not on file   Highest education level: Not on file  Occupational History   Not on file  Tobacco Use   Smoking status: Every Day    Packs/day: 1.00    Years: 25.00    Pack years: 25.00    Types: Cigarettes   Smokeless tobacco: Not on file  Substance and Sexual Activity   Alcohol use:  Yes    Comment: social drinker   Drug use: No   Sexual activity: Not on file  Other Topics Concern   Not on file  Social History Narrative   Not on file   Social Determinants of Health   Financial Resource Strain: Not on file  Food Insecurity: Not on file  Transportation Needs: Not on file  Physical Activity: Not on file  Stress: Not on file  Social Connections: Not on file  Intimate Partner Violence: Not on file    History reviewed. No pertinent family history. Allergies  Allergen Reactions   Aripiprazole Other (See Comments)    Suicidal thoughts Suicidal thoughts    I? Current Facility-Administered Medications  Medication Dose Route Frequency Provider Last Rate Last Admin   0.9 % NaCl with KCl 20 mEq/ L  infusion   Intravenous Continuous Lurene ShadowAyiku, Bernard, MD 125 mL/hr at 12/30/20 0927 New Bag at 12/30/20 0927   acetaminophen (TYLENOL) tablet 650 mg  650 mg Oral Q6H PRN Arvilla MarketWallace, Catherine Lauren, MD   650 mg at 12/30/20 1214   Or   acetaminophen (TYLENOL) suppository 650 mg  650 mg Rectal Q6H PRN Arvilla MarketWallace, Catherine Lauren, MD       ciprofloxacin (CIPRO) IVPB 400 mg  400 mg Intravenous Q12H Arvilla MarketWallace, Catherine Lauren, MD   Stopped at 12/30/20 1431   DULoxetine (CYMBALTA) DR capsule 20 mg  20 mg Oral BID Arvilla MarketWallace, Catherine Lauren, MD       enoxaparin (LOVENOX)  injection 65 mg  0.5 mg/kg Subcutaneous Q24H Arvilla Market, MD   65 mg at 12/30/20 0240   gabapentin (NEURONTIN) capsule 900 mg  900 mg Oral TID Arvilla Market, MD   900 mg at 12/30/20 1642   insulin aspart (novoLOG) injection 0-15 Units  0-15 Units Subcutaneous TID WC Arvilla Market, MD   5 Units at 12/30/20 1208   insulin glargine (LANTUS) injection 10 Units  10 Units Subcutaneous Daily Lurene Shadow, MD       mometasone-formoterol [facility]) 200-5 MCG/ACT inhaler 2 puff  2 puff Inhalation BID Arvilla Market, MD       morphine 4 MG/ML injection 4 mg  4 mg Intravenous Q4H PRN  Lurene Shadow, MD   4 mg at 12/30/20 1641   ondansetron (ZOFRAN) tablet 4 mg  4 mg Oral Q6H PRN Arvilla Market, MD       Or   ondansetron [facility]) injection 4 mg  4 mg Intravenous Q6H PRN Arvilla Market, MD   4 mg at 12/30/20 1641   pantoprazole (PROTONIX) EC tablet 40 mg  40 mg Oral Daily Arvilla Market, MD   40 mg at 12/30/20 1012   tiotropium [facility]) inhalation capsule ([facility] use ONLY) 18 mcg  18 mcg Inhalation Daily Arvilla Market, MD       traZODone (DESYREL) tablet 50 mg  50 mg Oral QHS Arvilla Market, MD   50 mg at 12/30/20 0222   vancomycin (VANCOCIN) IVPB 1000 mg/200 mL premix  1,000 mg Intravenous Once Rauer, Samantha O, RPH       vancomycin (VANCOREADY) IVPB 1500 mg/300 mL  1,500 mg Intravenous Once Rauer, Robyne Peers, RPH       [START ON 12/31/2020] vancomycin (VANCOREADY) IVPB 1750 mg/350 mL  1,750 mg Intravenous Q24H Rauer, Samantha O, RPH         Abtx:  Anti-infectives (From admission, onward)    Start     Dose/Rate Route Frequency Ordered Stop   12/31/20 1800  vancomycin (VANCOREADY) IVPB 1750 mg/350 mL        1,750 mg 175 mL/hr over 120 Minutes Intravenous Every 24 hours 12/30/20 1626     12/30/20 1830  vancomycin (VANCOCIN) IVPB 1000 mg/200 mL premix        1,000 mg 200 mL/hr over 60 Minutes Intravenous  Once 12/30/20 1633     12/30/20 1715  vancomycin (VANCOREADY) IVPB 1000 mg/200 mL  Status:  Discontinued        1,000 mg 200 mL/hr over 60 Minutes Intravenous  Once 12/30/20 1622 12/30/20 1632   12/30/20 1715  vancomycin (VANCOREADY) IVPB 1500 mg/300 mL        1,500 mg 150 mL/hr over 120 Minutes Intravenous  Once 12/30/20 1624     12/30/20 1630  vancomycin (VANCOCIN) 2,500 mg in sodium chloride 0.9 % 500 mL IVPB  Status:  Discontinued        2,500 mg 250 mL/hr over 120 Minutes Intravenous  Once 12/30/20 1620 12/30/20 1622   12/30/20 0110  ciprofloxacin (CIPRO) IVPB 400 mg        400 mg 200 mL/hr over 60 Minutes  Intravenous Every 12 hours 12/30/20 0110     12/30/20 0110  metroNIDAZOLE (FLAGYL) IVPB 500 mg  Status:  Discontinued        500 mg 100 mL/hr over 60 Minutes Intravenous Every 8 hours 12/30/20 0110 12/30/20 1631   12/29/20 2100  ciprofloxacin (CIPRO) IVPB 400 mg  400 mg 200 mL/hr over 60 Minutes Intravenous  Once 12/29/20 2047 12/29/20 2339   12/29/20 2100  metroNIDAZOLE (FLAGYL) IVPB 500 mg        500 mg 100 mL/hr over 60 Minutes Intravenous  Once 12/29/20 2047 12/29/20 2339       REVIEW OF SYSTEMS:  Const: subjective fever,  chills, negative weight loss Eyes: negative diplopia or visual changes, negative eye pain ENT: negative coryza, negative sore throat Resp: negative cough, hemoptysis, dyspnea Cards: negative for chest pain, palpitations, lower extremity edema GU: negative for frequency, dysuria and hematuria GI: + abdominal pain, +diarrhea, ++ vomiting Skin: negative for rash and pruritus Heme: negative for easy bruising and gum/nose bleeding MS: weakness, body ache Neurolo:negative for headaches, dizziness, vertigo, memory problems  Psych: anxiety Endocrine: diabetes Allergy/Immunology- negative for any medication or food allergies ? Objective:  VITALS:  BP 115/69 (BP Location: Left Arm)   Pulse (!) 115   Temp 99.5 F (37.5 C) (Oral)   Resp 20   Ht 5\' 8"  (1.727 m)   Wt 131.6 kg   SpO2 99%   BMI 44.11 kg/m  PHYSICAL EXAM:  General: Alert, cooperative, no distress, huddled under blanket- room cold Head: Normocephalic, without obvious abnormality, atraumatic. Eyes: Conjunctivae clear, anicteric sclerae. Pupils are equal ENT Nares normal. No drainage or sinus tenderness. Lips, mucosa, and tongue normal. No Thrush Neck: Supple, symmetrical, no adenopathy, thyroid: non tender no carotid bruit and no JVD. Back: No CVA tenderness. Lungs: Clear to auscultation bilaterally. No Wheezing or Rhonchi. No rales. Heart: Regular rate and rhythm, no murmur, rub or  gallop. Abdomen: Soft, non-tender,not distended. Bowel sounds normal. No masses Extremities: atraumatic, no cyanosis. No edema. No clubbing Skin: No rashes or lesions. Or bruising Lymph: Cervical, supraclavicular normal. Neurologic: Grossly non-focal Pertinent Labs Lab Results CBC    Component Value Date/Time   WBC 6.5 12/30/2020 1441   RBC 5.06 12/30/2020 1441   HGB 12.5 12/30/2020 1441   HGB 13.1 08/04/2014 2238   HCT 38.7 12/30/2020 1441   HCT 42.1 08/04/2014 2238   PLT 208 12/30/2020 1441   PLT 336 08/04/2014 2238   MCV 76.5 (L) 12/30/2020 1441   MCV 77 (L) 08/04/2014 2238   MCH 24.7 (L) 12/30/2020 1441   MCHC 32.3 12/30/2020 1441   RDW 14.1 12/30/2020 1441   RDW 13.5 08/04/2014 2238   LYMPHSABS 4.0 (H) 04/11/2018 1301   LYMPHSABS 4.5 (H) 04/22/2013 2011   MONOABS 0.5 04/11/2018 1301   MONOABS 0.8 04/22/2013 2011   EOSABS 0.2 04/11/2018 1301   EOSABS 0.2 04/22/2013 2011   BASOSABS 0.1 04/11/2018 1301   BASOSABS 0.1 04/22/2013 2011    CMP Latest Ref Rng & Units 12/30/2020 12/29/2020 04/11/2018  Glucose 70 - 99 mg/dL 06/11/2018) 588(F) 027(X)  BUN 6 - 20 mg/dL 15 19 8   Creatinine 0.44 - 1.00 mg/dL 412(I) ) 7.86(V  Sodium 135 - 145 mmol/L 131(L) 130(L) 138  Potassium 3.5 - 5.1 mmol/L 3.5 3.6 3.6  Chloride 98 - 111 mmol/L 104 102 105  CO2 22 - 32 mmol/L 20(L) 19(L) 26  Calcium 8.9 - 10.3 mg/dL 7.7(L) 8.5(L) 9.0  Total Protein 6.5 - 8.1 g/dL - 7.7 7.3  Total Bilirubin 0.3 - 1.2 mg/dL - 0.9 0.5  Alkaline Phos 38 - 126 U/L - 71 74  AST 15 - 41 U/L - 52(H) 36  ALT 0 - 44 U/L - 48(H) 38      Microbiology: Recent Results (from the past 240 hour(s))  Resp Panel by RT-PCR (Flu A&B, Covid) Nasopharyngeal Swab     Status: None   Collection Time: 12/29/20  6:06 PM   Specimen: Nasopharyngeal Swab; Nasopharyngeal(NP) swabs in vial transport medium  Result Value Ref Range Status   SARS Coronavirus 2 by RT PCR NEGATIVE NEGATIVE Final    Comment: (NOTE) SARS-CoV-2 target  nucleic acids are NOT DETECTED.  The SARS-CoV-2 RNA is generally detectable in upper respiratory specimens during the acute phase of infection. The lowest concentration of SARS-CoV-2 viral copies this assay can detect is 138 copies/mL. A negative result does not preclude SARS-Cov-2 infection and should not be used as the sole basis for treatment or other patient management decisions. A negative result may occur with  improper specimen collection/handling, submission of specimen other than nasopharyngeal swab, presence of viral mutation(s) within the areas targeted by this assay, and inadequate number of viral copies(<138 copies/mL). A negative result must be combined with clinical observations, patient history, and epidemiological information. The expected result is Negative.  Fact Sheet for Patients:  BloggerCourse.com  Fact Sheet for Healthcare Providers:  SeriousBroker.it  This test is no t yet approved or cleared by the [place] FDA and  has been authorized for detection and/or diagnosis of SARS-CoV-2 by FDA under an Emergency Use Authorization (EUA). This EUA will remain  in effect (meaning this test can be used) for the duration of the COVID-19 declaration under Section 564(b)(1) of the Act, 21 U.S.C.section 360bbb-3(b)(1), unless the authorization is terminated  or revoked sooner.       Influenza A by PCR NEGATIVE NEGATIVE Final   Influenza B by PCR NEGATIVE NEGATIVE Final    Comment: (NOTE) The Xpert Xpress SARS-CoV-2/FLU/RSV plus assay is intended as an aid in the diagnosis of influenza from Nasopharyngeal swab specimens and should not be used as a sole basis for treatment. Nasal washings and aspirates are unacceptable for Xpert Xpress SARS-CoV-2/FLU/RSV testing.  Fact Sheet for Patients: BloggerCourse.com  Fact Sheet for Healthcare  Providers: SeriousBroker.it  This test is not yet approved or cleared by the [place] FDA and has been authorized for detection and/or diagnosis of SARS-CoV-2 by FDA under an Emergency Use Authorization (EUA). This EUA will remain in effect (meaning this test can be used) for the duration of the COVID-19 declaration under Section 564(b)(1) of the Act, 21 U.S.C. section 360bbb-3(b)(1), unless the authorization is terminated or revoked.  Performed at [facility], [address] [place], [place] [address]   Culture, blood (routine x 2)     Status: None (Preliminary result)   Collection Time: 12/29/20  9:44 PM   Specimen: BLOOD  Result Value Ref Range Status   Specimen Description BLOOD LEFT ASSIST CONTROL  Final   Special Requests   Final    BOTTLES DRAWN AEROBIC AND ANAEROBIC Blood Culture adequate volume   Culture  Setup Time   Final    Organism ID to follow IN BOTH AEROBIC AND ANAEROBIC BOTTLES GRAM POSITIVE COCCI CRITICAL RESULT CALLED TO, READ BACK BY AND VERIFIED WITH: Nash Mantis 12/30/20 1522 KLW Performed at [facility], [address] [place], [place] [address]    Culture GRAM POSITIVE COCCI  Final   Report Status PENDING  Incomplete  Culture, blood (routine x 2)     Status: None (Preliminary result)   Collection Time: 12/29/20  9:44 PM   Specimen: BLOOD  Result Value Ref Range Status   Specimen Description BLOOD RIGHT ASSIST CONTROL  Final   Special Requests  Final    BOTTLES DRAWN AEROBIC AND ANAEROBIC Blood Culture adequate volume   Culture  Setup Time   Final    GRAM POSITIVE COCCI IN CLUSTERS AEROBIC BOTTLE ONLY CRITICAL RESULT CALLED TO, READ BACK BY AND VERIFIED WITH: Nash Mantis 12/30/20 1545 JGF Performed at [facility], [address] [place], [place] [address]    Culture PENDING  Incomplete   Report Status PENDING  Incomplete  Blood Culture ID Panel (Reflexed)     Status: Abnormal    Collection Time: 12/29/20  9:44 PM  Result Value Ref Range Status   Enterococcus faecalis NOT DETECTED NOT DETECTED Final   Enterococcus Faecium NOT DETECTED NOT DETECTED Final   Listeria monocytogenes NOT DETECTED NOT DETECTED Final   Staphylococcus species DETECTED (A) NOT DETECTED Final    Comment: CRITICAL RESULT CALLED TO, READ BACK BY AND VERIFIED WITH: SAMANTHA RAUAR 12/30/20 1522 KLW    Staphylococcus aureus (BCID) NOT DETECTED NOT DETECTED Final   Staphylococcus epidermidis NOT DETECTED NOT DETECTED Final   Staphylococcus lugdunensis NOT DETECTED NOT DETECTED Final   Streptococcus species NOT DETECTED NOT DETECTED Final   Streptococcus agalactiae NOT DETECTED NOT DETECTED Final   Streptococcus pneumoniae NOT DETECTED NOT DETECTED Final   Streptococcus pyogenes NOT DETECTED NOT DETECTED Final   A.calcoaceticus-baumannii NOT DETECTED NOT DETECTED Final   Bacteroides fragilis NOT DETECTED NOT DETECTED Final   Enterobacterales NOT DETECTED NOT DETECTED Final   Enterobacter cloacae complex NOT DETECTED NOT DETECTED Final   Escherichia coli NOT DETECTED NOT DETECTED Final   Klebsiella aerogenes NOT DETECTED NOT DETECTED Final   Klebsiella oxytoca NOT DETECTED NOT DETECTED Final   Klebsiella pneumoniae NOT DETECTED NOT DETECTED Final   Proteus species NOT DETECTED NOT DETECTED Final   Salmonella species NOT DETECTED NOT DETECTED Final   Serratia marcescens NOT DETECTED NOT DETECTED Final   Haemophilus influenzae NOT DETECTED NOT DETECTED Final   Neisseria meningitidis NOT DETECTED NOT DETECTED Final   Pseudomonas aeruginosa NOT DETECTED NOT DETECTED Final   Stenotrophomonas maltophilia NOT DETECTED NOT DETECTED Final   Candida albicans NOT DETECTED NOT DETECTED Final   Candida auris NOT DETECTED NOT DETECTED Final   Candida glabrata NOT DETECTED NOT DETECTED Final   Candida krusei NOT DETECTED NOT DETECTED Final   Candida parapsilosis NOT DETECTED NOT DETECTED Final   Candida  tropicalis NOT DETECTED NOT DETECTED Final   Cryptococcus neoformans/gattii NOT DETECTED NOT DETECTED Final    Comment: Performed at [facility], [address] [place], [place] [address]  C Difficile Quick Screen w PCR reflex     Status: None   Collection Time: 12/30/20  6:19 AM   Specimen: Stool  Result Value Ref Range Status   C Diff antigen NEGATIVE NEGATIVE Final   C Diff toxin NEGATIVE NEGATIVE Final   C Diff interpretation No C. difficile detected.  Final    Comment: Performed at [facility], [address] [place], [place] [address]  Gastrointestinal Panel by PCR , Stool     Status: Abnormal   Collection Time: 12/30/20  6:19 AM   Specimen: Stool  Result Value Ref Range Status   Campylobacter species NOT DETECTED NOT DETECTED Final   Plesimonas shigelloides NOT DETECTED NOT DETECTED Final   Salmonella species DETECTED (A) NOT DETECTED Final    Comment: RESULT CALLED TO, READ BACK BY AND VERIFIED WITH: C.GUALDONI,RN 0945 12/30/20 GM    Yersinia enterocolitica NOT DETECTED NOT DETECTED Final   Vibrio species NOT  DETECTED NOT DETECTED Final   Vibrio cholerae NOT DETECTED NOT DETECTED Final   Enteroaggregative E coli (EAEC) NOT DETECTED NOT DETECTED Final   Enteropathogenic E coli (EPEC) NOT DETECTED NOT DETECTED Final   Enterotoxigenic E coli (ETEC) NOT DETECTED NOT DETECTED Final   Shiga like toxin producing E coli (STEC) NOT DETECTED NOT DETECTED Final   Shigella/Enteroinvasive E coli (EIEC) NOT DETECTED NOT DETECTED Final   Cryptosporidium NOT DETECTED NOT DETECTED Final   Cyclospora cayetanensis NOT DETECTED NOT DETECTED Final   Entamoeba histolytica NOT DETECTED NOT DETECTED Final   Giardia lamblia NOT DETECTED NOT DETECTED Final   Adenovirus F40/41 NOT DETECTED NOT DETECTED Final   Astrovirus NOT DETECTED NOT DETECTED Final   Norovirus GI/GII NOT DETECTED NOT DETECTED Final   Rotavirus A NOT DETECTED NOT DETECTED Final   Sapovirus (I, II, IV, and  V) NOT DETECTED NOT DETECTED Final    Comment: Performed at [facility], [address] [place], [place] [address]    IMAGING RESULTS: CT scan abdomen reviewed I have personally reviewed the films ? Impression/Recommendation ? ?salmonella gastroenteritis- on cipro- would give for 3 full days No immune compromised state  Staph species bacteremia- likely coag neg - need to r/o contaminant VS true pathogen-ok to give vanco for now Will repeat culture  Sepsis  AKI- improved  T2DM- on victoza  Discussed the management with patient  ? ___________________________________________________ Discussed with patient, requesting provider Note:  This document was prepared using Dragon voice recognition software and may include unintentional dictation errors.

## 2020-12-30 NOTE — Progress Notes (Addendum)
Pharmacy Antibiotic Note  Priscilla Moore is a 45 y.o. female  with medical history significant for type 2 diabetes mellitus, hyperlipidemia, hypertension, fibromyalgia, bipolar disorder, tachycardia, who presented to the hospital with a 2-day history of vomiting, watery nonbloody diarrhea, nausea and abdominal pain. Pharmacy has been consulted for vancomycin dosing for bacteremia. Pt also on ciprofloxacin and metronidazole. GI PCR with salmonella.   6/23 BCID: 3/4 bottles (2 aerobic and 1 anaerobic), GPC - staph species  Plan: Vancomycin 2500 mg x1 loading dose, followed by vancomycin 1750 mg IV q24 h Est AUC: 483 Est Cpk: 36.3 Est Ctr: 10.8  Obtain vancomycin level around 4th or 5th dose if continued Follow up repeat blood cultures    Height: 5\' 8"  (172.7 cm) Weight: 131.6 kg (290 lb 2 oz) IBW/kg (Calculated) : 63.9  Temp (24hrs), Avg:99.6 F (37.6 C), Min:99 F (37.2 C), Max:100.2 F (37.9 C)  Recent Labs  Lab 12/29/20 1733 12/29/20 1834 12/29/20 1859 12/29/20 2143 12/30/20 1441  WBC 9.5  --   --   --  6.5  CREATININE  --   --  1.65*  --  1.15*  LATICACIDVEN  --  3.8*  --  2.1*  --     Estimated Creatinine Clearance: 86.9 mL/min (A) (by C-G formula based on SCr of 1.15 mg/dL (H)).    Allergies  Allergen Reactions   Aripiprazole Other (See Comments)    Suicidal thoughts Suicidal thoughts     Antimicrobials this admission: 6/22 Ciprofloxacin >>  6/22 Metronidazole >>  6/23 Vancomycin >>  Dose adjustments this admission:   Microbiology results: 6/23 BCx: 3/4 bottles GPC, staph species 6/23 UCx: sent 6/23 GI panel: salmonella   Thank you for allowing pharmacy to be a part of this patient's care.  7/23, PharmD Pharmacy Resident  12/30/2020 4:44 PM

## 2020-12-30 NOTE — Progress Notes (Signed)
PHARMACY - PHYSICIAN COMMUNICATION CRITICAL VALUE ALERT - BLOOD CULTURE IDENTIFICATION (BCID)  Priscilla Moore is an 45 y.o. female who presented to [facility] on 12/29/2020 with a chief complaint of abdominal pain, diarrhea (watery nonbloody), emesis. Pt stool for C diff was negative; GI panel showed Salmonella.   Assessment:  3/4 bottles (2 aerobic and 1 anaerobic), GPC - staph species. Recommend addition of vancomycin and will follow up with sensitivities for de-escalation.   Name of physician (or Provider) Contacted: Dr. Myriam Forehand  Current antibiotics: Ciprofloxacin 400 mg IV q12 h and metronidazole 500 mg IV q8h  Changes to prescribed antibiotics recommended:  Recommendations accepted by provider - start vancomycin, dosing per pharmacy   Results for orders placed or performed during the hospital encounter of 12/29/20  Blood Culture ID Panel (Reflexed) (Collected: 12/29/2020  9:44 PM)  Result Value Ref Range   Enterococcus faecalis NOT DETECTED NOT DETECTED   Enterococcus Faecium NOT DETECTED NOT DETECTED   Listeria monocytogenes NOT DETECTED NOT DETECTED   Staphylococcus species DETECTED (A) NOT DETECTED   Staphylococcus aureus (BCID) NOT DETECTED NOT DETECTED   Staphylococcus epidermidis NOT DETECTED NOT DETECTED   Staphylococcus lugdunensis NOT DETECTED NOT DETECTED   Streptococcus species NOT DETECTED NOT DETECTED   Streptococcus agalactiae NOT DETECTED NOT DETECTED   Streptococcus pneumoniae NOT DETECTED NOT DETECTED   Streptococcus pyogenes NOT DETECTED NOT DETECTED   A.calcoaceticus-baumannii NOT DETECTED NOT DETECTED   Bacteroides fragilis NOT DETECTED NOT DETECTED   Enterobacterales NOT DETECTED NOT DETECTED   Enterobacter cloacae complex NOT DETECTED NOT DETECTED   Escherichia coli NOT DETECTED NOT DETECTED   Klebsiella aerogenes NOT DETECTED NOT DETECTED   Klebsiella oxytoca NOT DETECTED NOT DETECTED   Klebsiella pneumoniae NOT DETECTED NOT DETECTED   Proteus species  NOT DETECTED NOT DETECTED   Salmonella species NOT DETECTED NOT DETECTED   Serratia marcescens NOT DETECTED NOT DETECTED   Haemophilus influenzae NOT DETECTED NOT DETECTED   Neisseria meningitidis NOT DETECTED NOT DETECTED   Pseudomonas aeruginosa NOT DETECTED NOT DETECTED   Stenotrophomonas maltophilia NOT DETECTED NOT DETECTED   Candida albicans NOT DETECTED NOT DETECTED   Candida auris NOT DETECTED NOT DETECTED   Candida glabrata NOT DETECTED NOT DETECTED   Candida krusei NOT DETECTED NOT DETECTED   Candida parapsilosis NOT DETECTED NOT DETECTED   Candida tropicalis NOT DETECTED NOT DETECTED   Cryptococcus neoformans/gattii NOT DETECTED NOT DETECTED   Raiford Noble, PharmD Pharmacy Resident  12/30/2020 4:28 PM

## 2020-12-30 NOTE — Progress Notes (Signed)
Inpatient Diabetes Program Recommendations  AACE/ADA: New Consensus Statement on Inpatient Glycemic Control   Target Ranges:  Prepandial:   less than 140 mg/dL      Peak postprandial:   less than 180 mg/dL (1-2 hours)      Critically ill patients:  140 - 180 mg/dL   Results for Priscilla Moore, Priscilla Moore (MRN [id number]) as of 12/30/2020 12:53  Ref. Range 12/29/2020 18:25 12/30/2020 08:13 12/30/2020 11:58  Glucose-Capillary Latest Ref Range: 70 - 99 mg/dL 812 (H) 751 (H) 700 (H)    Review of Glycemic Control  Diabetes history: DM2 Outpatient Diabetes medications: Victoza 1.8 mg daily Current orders for Inpatient glycemic control: Novolog 0-15 units TID with meals  Inpatient Diabetes Program Recommendations:    Insulin: Please consider ordering Lantus 13 units Q24H (based on 131 kg x 0.1 units).  Thanks, Orlando Penner, RN, MSN, CDE Diabetes Coordinator Inpatient Diabetes Program [phone] (Team Pager from 8am to 5pm)

## 2020-12-30 NOTE — ED Notes (Signed)
Pt up to the toilet, IV from the right FA pulled out. Bleeding controlled, clean bandage applied, fluids switched to LAC.

## 2020-12-30 NOTE — Progress Notes (Signed)
Anticoagulation monitoring(Lovenox):  45 yo female ordered Lovenox 40 mg Q24h    Filed Weights   12/29/20 1730 12/29/20 1808  Weight: 121.6 kg (268 lb) 131.6 kg (290 lb 2 oz)   BMI 44   Lab Results  Component Value Date   CREATININE 1.65 (H) 12/29/2020   CREATININE 0.89 04/11/2018   CREATININE 1.06 (H) 02/25/2015   Estimated Creatinine Clearance: 60.6 mL/min (A) (by C-G formula based on SCr of 1.65 mg/dL (H)). Hemoglobin & Hematocrit     Component Value Date/Time   HGB 14.6 12/29/2020 1733   HGB 13.1 08/04/2014 2238   HCT 44.4 12/29/2020 1733   HCT 42.1 08/04/2014 2238     Per Protocol for Patient with estCrcl > 30 ml/min and BMI > 30, will transition to Lovenox 65 mg Q24h.

## 2020-12-30 NOTE — ED Notes (Signed)
Lab called specimen positive for salmonella

## 2020-12-30 NOTE — Progress Notes (Addendum)
Progress Note    Priscilla Moore  YHC:[id number] DOB: Sep 10, 1975  DOA: 12/29/2020 PCP: [facility]      Brief Narrative:    Medical records reviewed and are as summarized below:  Priscilla Moore is a 45 y.o. female with medical history significant for type 2 diabetes mellitus, hyperlipidemia, hypertension, fibromyalgia, bipolar disorder, tachycardia, who presented to the hospital with a 2-day history of vomiting, watery nonbloody diarrhea, nausea and abdominal pain.  She was admitted to the hospital for severe sepsis secondary to acute colitis complicated by AKI.  GI panel showed Salmonella.  Stool for C. difficile toxin was negative.  Blood culture showed gram-positive cocci in clusters.  She was treated with analgesics, IV fluids and empiric IV antibiotics.    Assessment/Plan:   Active Problems:   Bipolar 1 disorder (HCC)   Type 2 diabetes mellitus with hyperglycemia, without long-term current use of insulin (HCC)   Fibromyalgia   Colitis   Severe sepsis (HCC)   Abdominal pain, vomiting, and diarrhea   AKI (acute kidney injury) (HCC)   Dyslipidemia   COPD (chronic obstructive pulmonary disease) (HCC)   Body mass index is 44.11 kg/m.  (Morbid obesity)  Severe sepsis secondary to acute colitis, gram-positive cocci bacteremia: Continue empiric IV antibiotics and IV fluids.  Change IV fluids from normal saline to normal saline with potassium chloride.  Analgesics as needed for pain.  IV morphine has been added for adequate pain control.  GI panel showed Salmonella species.  Blood culture is growing gram-positive cocci in clusters.  Repeat blood cultures and add IV vancomycin.  Place on contact precautions.  Obtain 2D echo to rule out valvular vegetations/endocarditis.  Consult ID to assist with management.  AKI: Repeat BMP has still not been collected.  I called laboratory to follow-up on this.  Repeat BMP tomorrow.  Abnormal urinalysis: Urine culture is  pending.  Continue empiric IV antibiotics.  Type II DM with hyperglycemia: Increase NovoLog as needed for hyperglycemia.  Start low-dose Lantus for glucose control.  COPD: Stable.  Continue bronchodilators.  Other comorbidities include hepatic steatosis, fibromyalgia, history of tachycardia,  Diet Order             Diet clear liquid Room service appropriate? Yes; Fluid consistency: Thin  Diet effective now                      Consultants: None  Procedures: None    Medications:    DULoxetine  20 mg Oral BID   enoxaparin (LOVENOX) injection  0.5 mg/kg Subcutaneous Q24H   gabapentin  900 mg Oral TID   insulin aspart  0-15 Units Subcutaneous TID WC   insulin glargine  10 Units Subcutaneous Daily   mometasone-formoterol  2 puff Inhalation BID   pantoprazole  40 mg Oral Daily   tiotropium  18 mcg Inhalation Daily   traZODone  50 mg Oral QHS   Continuous Infusions:  0.9 % NaCl with KCl 20 mEq / L 125 mL/hr at 12/30/20 5176   ciprofloxacin 400 mg (12/30/20 1325)   metronidazole Stopped (12/30/20 1118)     Anti-infectives (From admission, onward)    Start     Dose/Rate Route Frequency Ordered Stop   12/30/20 0110  ciprofloxacin (CIPRO) IVPB 400 mg        400 mg 200 mL/hr over 60 Minutes Intravenous Every 12 hours 12/30/20 0110     12/30/20 0110  metroNIDAZOLE (FLAGYL) IVPB 500 mg  500 mg 100 mL/hr over 60 Minutes Intravenous Every 8 hours 12/30/20 0110     12/29/20 2100  ciprofloxacin (CIPRO) IVPB 400 mg        400 mg 200 mL/hr over 60 Minutes Intravenous  Once 12/29/20 2047 12/29/20 2339   12/29/20 2100  metroNIDAZOLE (FLAGYL) IVPB 500 mg        500 mg 100 mL/hr over 60 Minutes Intravenous  Once 12/29/20 2047 12/29/20 2339              Family Communication/Anticipated D/C date and plan/Code Status   DVT prophylaxis:      Code Status: Full Code  Family Communication: Her daughter and her brother at the bedside Disposition Plan:     Status is: Inpatient  Remains inpatient appropriate because:IV treatments appropriate due to intensity of illness or inability to take PO  Dispo: The patient is from: Home              Anticipated d/c is to: Home              Patient currently is not medically stable to d/c.   Difficult to place patient No           Subjective:   C/o nausea, diarrhea and abdominal pain.  No vomiting.  Her brother and daughter were at the bedside.  Objective:    Vitals:   12/30/20 0915 12/30/20 1214 12/30/20 1214 12/30/20 1215  BP: 132/86 133/86  133/86  Pulse:  (!) 129  (!) 127  Resp: 20 (!) 27  (!) 26  Temp:   100.2 F (37.9 C)   TempSrc:   Oral   SpO2:  97%  97%  Weight:      Height:       No data found.   Intake/Output Summary (Last 24 hours) at 12/30/2020 1429 Last data filed at 12/30/2020 1962 Gross per 24 hour  Intake 1903.7 ml  Output --  Net 1903.7 ml   Filed Weights   12/29/20 1730 12/29/20 1808  Weight: 121.6 kg 131.6 kg    Exam:  GEN: NAD SKIN: No rash EYES: EOMI ENT: MMM CV: RRR, tachycardic PULM: CTA B ABD: soft, obese, mid abdominal tenderness, no rebound tenderness or guarding, +BS CNS: AAO x 3, non focal EXT: No edema or tenderness        Data Reviewed:   I have personally reviewed following labs and imaging studies:  Labs: Labs show the following:   Basic Metabolic Panel: Recent Labs  Lab 12/29/20 1859  NA 130*  K 3.6  CL 102  CO2 19*  GLUCOSE 286*  BUN 19  CREATININE 1.65*  CALCIUM 8.5*   GFR Estimated Creatinine Clearance: 60.6 mL/min (A) (by C-G formula based on SCr of 1.65 mg/dL (H)). Liver Function Tests: Recent Labs  Lab 12/29/20 1859  AST 52*  ALT 48*  ALKPHOS 71  BILITOT 0.9  PROT 7.7  ALBUMIN 3.3*   Recent Labs  Lab 12/29/20 1859  LIPASE 20   No results for input(s): AMMONIA in the last 168 hours. Coagulation profile No results for input(s): INR, PROTIME in the last 168 hours.  CBC: Recent  Labs  Lab 12/29/20 1733  WBC 9.5  HGB 14.6  HCT 44.4  MCV 76.7*  PLT 277   Cardiac Enzymes: No results for input(s): CKTOTAL, CKMB, CKMBINDEX, TROPONINI in the last 168 hours. BNP (last 3 results) No results for input(s): PROBNP in the last 8760 hours. CBG: Recent Labs  Lab  12/29/20 1825 12/30/20 0813 12/30/20 1158  GLUCAP 308* 204* 204*   D-Dimer: No results for input(s): DDIMER in the last 72 hours. Hgb A1c: No results for input(s): HGBA1C in the last 72 hours. Lipid Profile: No results for input(s): CHOL, HDL, LDLCALC, TRIG, CHOLHDL, LDLDIRECT in the last 72 hours. Thyroid function studies: No results for input(s): TSH, T4TOTAL, T3FREE, THYROIDAB in the last 72 hours.  Invalid input(s): FREET3 Anemia work up: No results for input(s): VITAMINB12, FOLATE, FERRITIN, TIBC, IRON, RETICCTPCT in the last 72 hours. Sepsis Labs: Recent Labs  Lab 12/29/20 1733 12/29/20 1834 12/29/20 2143  WBC 9.5  --   --   LATICACIDVEN  --  3.8* 2.1*    Microbiology Recent Results (from the past 240 hour(s))  Resp Panel by RT-PCR (Flu A&B, Covid) Nasopharyngeal Swab     Status: None   Collection Time: 12/29/20  6:06 PM   Specimen: Nasopharyngeal Swab; Nasopharyngeal(NP) swabs in vial transport medium  Result Value Ref Range Status   SARS Coronavirus 2 by RT PCR NEGATIVE NEGATIVE Final    Comment: (NOTE) SARS-CoV-2 target nucleic acids are NOT DETECTED.  The SARS-CoV-2 RNA is generally detectable in upper respiratory specimens during the acute phase of infection. The lowest concentration of SARS-CoV-2 viral copies this assay can detect is 138 copies/mL. A negative result does not preclude SARS-Cov-2 infection and should not be used as the sole basis for treatment or other patient management decisions. A negative result may occur with  improper specimen collection/handling, submission of specimen other than nasopharyngeal swab, presence of viral mutation(s) within the areas  targeted by this assay, and inadequate number of viral copies(<138 copies/mL). A negative result must be combined with clinical observations, patient history, and epidemiological information. The expected result is Negative.  Fact Sheet for Patients:  BloggerCourse.com  Fact Sheet for Healthcare Providers:  SeriousBroker.it  This test is no t yet approved or cleared by the [place] FDA and  has been authorized for detection and/or diagnosis of SARS-CoV-2 by FDA under an Emergency Use Authorization (EUA). This EUA will remain  in effect (meaning this test can be used) for the duration of the COVID-19 declaration under Section 564(b)(1) of the Act, 21 U.S.C.section 360bbb-3(b)(1), unless the authorization is terminated  or revoked sooner.       Influenza A by PCR NEGATIVE NEGATIVE Final   Influenza B by PCR NEGATIVE NEGATIVE Final    Comment: (NOTE) The Xpert Xpress SARS-CoV-2/FLU/RSV plus assay is intended as an aid in the diagnosis of influenza from Nasopharyngeal swab specimens and should not be used as a sole basis for treatment. Nasal washings and aspirates are unacceptable for Xpert Xpress SARS-CoV-2/FLU/RSV testing.  Fact Sheet for Patients: BloggerCourse.com  Fact Sheet for Healthcare Providers: SeriousBroker.it  This test is not yet approved or cleared by the [place] FDA and has been authorized for detection and/or diagnosis of SARS-CoV-2 by FDA under an Emergency Use Authorization (EUA). This EUA will remain in effect (meaning this test can be used) for the duration of the COVID-19 declaration under Section 564(b)(1) of the Act, 21 U.S.C. section 360bbb-3(b)(1), unless the authorization is terminated or revoked.  Performed at [facility], [address] [place], [place] [address]   Culture, blood (routine x 2)     Status: None (Preliminary  result)   Collection Time: 12/29/20  9:44 PM   Specimen: BLOOD  Result Value Ref Range Status   Specimen Description BLOOD LEFT ASSIST CONTROL  Final  Special Requests   Final    BOTTLES DRAWN AEROBIC AND ANAEROBIC Blood Culture adequate volume   Culture  Setup Time   Final    Organism ID to follow ANAEROBIC BOTTLE ONLY GRAM POSITIVE COCCI Performed at [facility], [address] [place], [place] [address]    Culture PENDING  Incomplete   Report Status PENDING  Incomplete  Culture, blood (routine x 2)     Status: None (Preliminary result)   Collection Time: 12/29/20  9:44 PM   Specimen: BLOOD  Result Value Ref Range Status   Specimen Description BLOOD RIGHT ASSIST CONTROL  Final   Special Requests   Final    BOTTLES DRAWN AEROBIC AND ANAEROBIC Blood Culture adequate volume   Culture   Final    NO GROWTH < 12 HOURS Performed at [facility], [address] [place], [place] [address]    Report Status PENDING  Incomplete  C Difficile Quick Screen w PCR reflex     Status: None   Collection Time: 12/30/20  6:19 AM   Specimen: Stool  Result Value Ref Range Status   C Diff antigen NEGATIVE NEGATIVE Final   C Diff toxin NEGATIVE NEGATIVE Final   C Diff interpretation No C. difficile detected.  Final    Comment: Performed at [facility], [address] [place], [place] [address]  Gastrointestinal Panel by PCR , Stool     Status: Abnormal   Collection Time: 12/30/20  6:19 AM   Specimen: Stool  Result Value Ref Range Status   Campylobacter species NOT DETECTED NOT DETECTED Final   Plesimonas shigelloides NOT DETECTED NOT DETECTED Final   Salmonella species DETECTED (A) NOT DETECTED Final    Comment: RESULT CALLED TO, READ BACK BY AND VERIFIED WITH: C.[email or web address],RN [id number] 12/30/20 GM    Yersinia enterocolitica NOT DETECTED NOT DETECTED Final   Vibrio species NOT DETECTED NOT DETECTED Final   Vibrio cholerae NOT DETECTED NOT DETECTED Final    Enteroaggregative E coli (EAEC) NOT DETECTED NOT DETECTED Final   Enteropathogenic E coli (EPEC) NOT DETECTED NOT DETECTED Final   Enterotoxigenic E coli (ETEC) NOT DETECTED NOT DETECTED Final   Shiga like toxin producing E coli (STEC) NOT DETECTED NOT DETECTED Final   Shigella/Enteroinvasive E coli (EIEC) NOT DETECTED NOT DETECTED Final   Cryptosporidium NOT DETECTED NOT DETECTED Final   Cyclospora cayetanensis NOT DETECTED NOT DETECTED Final   Entamoeba histolytica NOT DETECTED NOT DETECTED Final   Giardia lamblia NOT DETECTED NOT DETECTED Final   Adenovirus F40/41 NOT DETECTED NOT DETECTED Final   Astrovirus NOT DETECTED NOT DETECTED Final   Norovirus GI/GII NOT DETECTED NOT DETECTED Final   Rotavirus A NOT DETECTED NOT DETECTED Final   Sapovirus (I, II, IV, and V) NOT DETECTED NOT DETECTED Final    Comment: Performed at [facility], [address] [place], [place] [address]    Procedures and diagnostic studies:  CT ABDOMEN PELVIS WO CONTRAST  Result Date: 12/29/2020 CLINICAL DATA:  Abdominal pain, vomiting and diarrhea for 2 days. Diverticulitis suspected. EXAM: CT ABDOMEN AND PELVIS WITHOUT CONTRAST TECHNIQUE: Multidetector CT imaging of the abdomen and pelvis was performed following the Moore protocol without IV contrast. COMPARISON:  None. FINDINGS: Lower chest: No acute airspace disease or pleural effusion. Hepatobiliary: The liver is enlarged spanning 20 cm cranial caudal. Advanced hepatic steatosis. There is no discrete focal lesion. High-density material in the gallbladder may represent sludge or noncalcified stones. No pericholecystic inflammation or abnormal distention.  No common bile duct dilatation. Pancreas: No ductal dilatation or inflammation. Spleen: Normal in size without focal abnormality. Splenule anteriorly. Adrenals/Urinary Tract: Normal adrenal glands. No hydronephrosis or renal calculi. No perinephric edema. No evidence of focal renal lesion on this  noncontrast exam. Partially distended urinary bladder. No bladder wall thickening. Stomach/Bowel: Patulous distal esophagus. Nondistended stomach. Tiny intramural lipoma in the duodenum, nonobstructing and of no clinical significance. Decompressed proximal small bowel. Distal small bowel loops are fluid-filled and mildly prominent, no transition point. Normal appendix. Diffuse pericolonic fat stranding and liquid stool throughout the colon. Wall thickening involving the cecum and proximal ascending colon. No bowel pneumatosis. No significant diverticular disease. Vascular/Lymphatic: Small ileocolic lymph nodes are likely reactive, but nonspecific. There is scattered central mesenteric and retroperitoneal nodes, not enlarged by size criteria. Normal caliber abdominal aorta. No portal venous or mesenteric gas. Reproductive: Uterus and bilateral adnexa are unremarkable. Other: No ascites or free air. No focal fluid collection. Small fat containing umbilical hernia. Musculoskeletal: There are no acute or suspicious osseous abnormalities. IMPRESSION: 1. Diffuse pericolonic fat stranding and liquid stool throughout the colon consistent with colitis. Wall thickening involving the cecum and proximal ascending colon 2. Hepatomegaly and advanced hepatic steatosis. 3. High-density material in the gallbladder may represent sludge or noncalcified stones. No pericholecystic inflammation or abnormal distention. Electronically Signed   By: Narda Rutherford M.D.   On: 12/29/2020 20:05   DG Chest Port 1 View  Result Date: 12/29/2020 CLINICAL DATA:  Pt comes into the ED via POV c.o generalized abd pain, emesis, and diarrhea x 2 days. Pt in NAD at this time with even and unlabored respirations.pt tachycardic in ER roomtachycardia EXAM: PORTABLE CHEST 1 VIEW COMPARISON:  None. FINDINGS: Normal mediastinum and cardiac silhouette. Normal pulmonary vasculature. No evidence of effusion, infiltrate, or pneumothorax. No acute bony  abnormality. Anterior cervical fusion. IMPRESSION: No acute cardiopulmonary process. Electronically Signed   By: Genevive Bi M.D.   On: 12/29/2020 18:55               LOS: 1 day   Jaleiah Asay  [facility]   Pager on [email or web address]. If 7PM-7AM, please contact night-coverage at [email or web address]     12/30/2020, 2:29 PM

## 2020-12-31 ENCOUNTER — Inpatient Hospital Stay: Admit: 2020-12-31 | Payer: Medicaid Other

## 2020-12-31 ENCOUNTER — Inpatient Hospital Stay (HOSPITAL_COMMUNITY)
Admit: 2020-12-31 | Discharge: 2020-12-31 | Disposition: A | Discharge status: Home / Self Care | Payer: Medicaid Other | Attending: Internal Medicine | Admitting: Internal Medicine

## 2020-12-31 DIAGNOSIS — K529 Noninfective gastroenteritis and colitis, unspecified: Secondary | ICD-10-CM

## 2020-12-31 DIAGNOSIS — R7881 Bacteremia: Secondary | ICD-10-CM

## 2020-12-31 DIAGNOSIS — B958 Unspecified staphylococcus as the cause of diseases classified elsewhere: Secondary | ICD-10-CM

## 2020-12-31 DIAGNOSIS — E1165 Type 2 diabetes mellitus with hyperglycemia: Secondary | ICD-10-CM

## 2020-12-31 LAB — COMPREHENSIVE METABOLIC PANEL
ALT: 50 U/L — ABNORMAL HIGH (ref 0–44)
AST: 62 U/L — ABNORMAL HIGH (ref 15–41)
Albumin: 2.9 g/dL — ABNORMAL LOW (ref 3.5–5.0)
Alkaline Phosphatase: 53 U/L (ref 38–126)
Anion gap: 6 (ref 5–15)
BUN: 12 mg/dL (ref 6–20)
CO2: 23 mmol/L (ref 22–32)
Calcium: 7.8 mg/dL — ABNORMAL LOW (ref 8.9–10.3)
Chloride: 103 mmol/L (ref 98–111)
Creatinine, Ser: 0.99 mg/dL (ref 0.44–1.00)
GFR, Estimated: >60 mL/min (ref >60)
Glucose, Bld: 174 mg/dL — ABNORMAL HIGH (ref 70–99)
Potassium: 3.5 mmol/L (ref 3.5–5.1)
Sodium: 132 mmol/L — ABNORMAL LOW (ref 135–145)
Total Bilirubin: 0.8 mg/dL (ref 0.3–1.2)
Total Protein: 6.9 g/dL (ref 6.5–8.1)

## 2020-12-31 LAB — ECHOCARDIOGRAM COMPLETE
AR max vel: 1.85 cm2
AV Area VTI: 1.83 cm2
AV Area mean vel: 1.77 cm2
AV Mean grad: 6 mmHg
AV Peak grad: 10.4 mmHg
Ao pk vel: 1.61 m/s
Area-P 1/2: 4.33 cm2
Height: 68 in
MV VTI: 3.49 cm2
S' Lateral: 3 cm
Single Plane A4C EF: 56.5 %
Weight: 4642.01 oz

## 2020-12-31 LAB — GLUCOSE, CAPILLARY
Glucose-Capillary: 157 mg/dL — ABNORMAL HIGH (ref 70–99)
Glucose-Capillary: 179 mg/dL — ABNORMAL HIGH (ref 70–99)
Glucose-Capillary: 149 mg/dL — ABNORMAL HIGH (ref 70–99)
Glucose-Capillary: 143 mg/dL — ABNORMAL HIGH (ref 70–99)

## 2020-12-31 LAB — URINE CULTURE: Culture: <10000 — AB

## 2020-12-31 LAB — HEMOGLOBIN A1C
Hgb A1c MFr Bld: 10.7 % — ABNORMAL HIGH (ref 4.8–5.6)
Mean Plasma Glucose: 260 mg/dL

## 2020-12-31 LAB — PHOSPHORUS: Phosphorus: 1.9 mg/dL — ABNORMAL LOW (ref 2.5–4.6)

## 2020-12-31 LAB — MAGNESIUM: Magnesium: 1.6 mg/dL — ABNORMAL LOW (ref 1.7–2.4)

## 2020-12-31 MED ORDER — K PHOS MONO-SOD PHOS DI & MONO 155-852-130 MG PO TABS
500.0000 mg | ORAL_TABLET | Freq: Three times a day (TID) | ORAL | Status: AC
  Administered 2020-12-31 (×3): 500 mg via ORAL
  Filled 2020-12-31 (×3): qty 2

## 2020-12-31 MED ORDER — VANCOMYCIN HCL 1000 MG/200ML IV SOLN
1000.0000 mg | Freq: Two times a day (BID) | INTRAVENOUS | Status: DC
  Filled 2020-12-31 (×2): qty 200

## 2020-12-31 MED ORDER — VANCOMYCIN HCL IN DEXTROSE 1-5 GM/200ML-% IV SOLN
1000.0000 mg | Freq: Two times a day (BID) | INTRAVENOUS | Status: DC
  Administered 2020-12-31 – 2021-01-02 (×4): 1000 mg via INTRAVENOUS
  Filled 2020-12-31 (×6): qty 200

## 2020-12-31 MED ORDER — LOPERAMIDE HCL 2 MG PO CAPS
4.0000 mg | ORAL_CAPSULE | Freq: Once | ORAL | Status: AC
  Administered 2020-12-31: 4 mg via ORAL
  Filled 2020-12-31: qty 2

## 2020-12-31 MED ORDER — LOPERAMIDE HCL 2 MG PO CAPS
2.0000 mg | ORAL_CAPSULE | Freq: Four times a day (QID) | ORAL | Status: DC | PRN
  Administered 2021-01-01 – 2021-01-02 (×5): 2 mg via ORAL
  Filled 2020-12-31 (×6): qty 1

## 2020-12-31 MED ORDER — MAGNESIUM SULFATE 2 GM/50ML IV SOLN
2.0000 g | Freq: Once | INTRAVENOUS | Status: AC
  Administered 2020-12-31: 2 g via INTRAVENOUS
  Filled 2020-12-31: qty 50

## 2020-12-31 MED ORDER — PERFLUTREN LIPID MICROSPHERE
1.0000 mL | INTRAVENOUS | Status: AC | PRN
  Administered 2020-12-31: 3 mL via INTRAVENOUS
  Filled 2020-12-31: qty 10

## 2020-12-31 NOTE — Progress Notes (Signed)
Progress Note    Priscilla Moore  ZOX:[id number] DOB: 1976-05-09  DOA: 12/29/2020 PCP: [facility]      Brief Narrative:    Medical records reviewed and are as summarized below:  Priscilla Moore is a 45 y.o. female with medical history significant for type 2 diabetes mellitus, hyperlipidemia, hypertension, fibromyalgia, bipolar disorder, tachycardia, who presented to the hospital with a 2-day history of vomiting, watery nonbloody diarrhea, nausea and abdominal pain.  She was admitted to the hospital for severe sepsis secondary to acute colitis complicated by AKI.  GI panel showed Salmonella.  Stool for C. difficile toxin was negative.  Blood culture showed gram-positive cocci in clusters.  She was treated with analgesics, IV fluids and empiric IV antibiotics.    Assessment/Plan:   Active Problems:   Bipolar 1 disorder (HCC)   Type 2 diabetes mellitus with hyperglycemia, without long-term current use of insulin (HCC)   Fibromyalgia   Colitis   Severe sepsis (HCC)   Abdominal pain, vomiting, and diarrhea   AKI (acute kidney injury) (HCC)   Dyslipidemia   COPD (chronic obstructive pulmonary disease) (HCC)   Body mass index is 44.11 kg/m.  (Morbid obesity)  Severe sepsis secondary to acute Salmonella gastroenteritis/colitis, severe diarrhea, gram-positive cocci bacteremia: Continue empiric IV antibiotics and IV fluids.  Add Imodium as needed for severe diarrhea.  2D echo is pending.  Follow-up with ID for further recommendations.    AKI: Improved.  Continue IV fluids because of severe diarrhea.  Hypomagnesemia and hypophosphatemia: Replete magnesium and phosphorus with IV magnesium sulfate and oral Neutra-Phos respectively.  Abnormal urinalysis but no growth on urine culture.  No UTI.  Type II DM with hyperglycemia: Continue Lantus and NovoLog.  COPD: Stable.  Continue bronchodilators.  Elevated liver enzymes: This is likely from hepatic  steatosis.  Other comorbidities include hepatic steatosis, fibromyalgia, history of tachycardia,  Diet Order             Diet clear liquid Room service appropriate? Yes; Fluid consistency: Thin  Diet effective now                      Consultants: None  Procedures: None    Medications:    DULoxetine  20 mg Oral BID   enoxaparin (LOVENOX) injection  0.5 mg/kg Subcutaneous Q24H   gabapentin  900 mg Oral TID   insulin aspart  0-15 Units Subcutaneous TID WC   insulin glargine  10 Units Subcutaneous Daily   loperamide  4 mg Oral Once   mometasone-formoterol  2 puff Inhalation BID   pantoprazole  40 mg Oral Daily   phosphorus  500 mg Oral TID   tiotropium  18 mcg Inhalation Daily   traZODone  50 mg Oral QHS   Continuous Infusions:  0.9 % NaCl with KCl 20 mEq / L 125 mL/hr at 12/31/20 0912   ciprofloxacin 400 mg (12/31/20 1344)   vancomycin       Anti-infectives (From admission, onward)    Start     Dose/Rate Route Frequency Ordered Stop   12/31/20 2200  vancomycin (VANCOREADY) IVPB 1750 mg/350 mL  Status:  Discontinued        1,750 mg 175 mL/hr over 120 Minutes Intravenous Every 24 hours 12/30/20 1954 12/31/20 0744   12/31/20 1800  vancomycin (VANCOREADY) IVPB 1750 mg/350 mL  Status:  Discontinued        1,750 mg 175 mL/hr over 120 Minutes Intravenous Every 24 hours  12/30/20 1626 12/30/20 1954   12/31/20 1200  vancomycin (VANCOREADY) IVPB 1000 mg/200 mL  Status:  Discontinued        1,000 mg 200 mL/hr over 60 Minutes Intravenous Every 12 hours 12/31/20 0744 12/31/20 0814   12/31/20 1200  vancomycin (VANCOCIN) IVPB 1000 mg/200 mL premix        1,000 mg 200 mL/hr over 60 Minutes Intravenous Every 12 hours 12/31/20 0814     12/30/20 2200  vancomycin (VANCOCIN) IVPB 1000 mg/200 mL premix        1,000 mg 200 mL/hr over 60 Minutes Intravenous  Once 12/30/20 1953 12/31/20 0022   12/30/20 2000  vancomycin (VANCOREADY) IVPB 1500 mg/300 mL        1,500 mg 150 mL/hr  over 120 Minutes Intravenous  Once 12/30/20 1953 12/30/20 2216   12/30/20 1830  vancomycin (VANCOCIN) IVPB 1000 mg/200 mL premix  Status:  Discontinued        1,000 mg 200 mL/hr over 60 Minutes Intravenous  Once 12/30/20 1633 12/30/20 1953   12/30/20 1715  vancomycin (VANCOREADY) IVPB 1000 mg/200 mL  Status:  Discontinued        1,000 mg 200 mL/hr over 60 Minutes Intravenous  Once 12/30/20 1622 12/30/20 1632   12/30/20 1715  vancomycin (VANCOREADY) IVPB 1500 mg/300 mL  Status:  Discontinued        1,500 mg 150 mL/hr over 120 Minutes Intravenous  Once 12/30/20 1624 12/30/20 1953   12/30/20 1630  vancomycin (VANCOCIN) 2,500 mg in sodium chloride 0.9 % 500 mL IVPB  Status:  Discontinued        2,500 mg 250 mL/hr over 120 Minutes Intravenous  Once 12/30/20 1620 12/30/20 1622   12/30/20 0110  ciprofloxacin (CIPRO) IVPB 400 mg        400 mg 200 mL/hr over 60 Minutes Intravenous Every 12 hours 12/30/20 0110     12/30/20 0110  metroNIDAZOLE (FLAGYL) IVPB 500 mg  Status:  Discontinued        500 mg 100 mL/hr over 60 Minutes Intravenous Every 8 hours 12/30/20 0110 12/30/20 1631   12/29/20 2100  ciprofloxacin (CIPRO) IVPB 400 mg        400 mg 200 mL/hr over 60 Minutes Intravenous  Once 12/29/20 2047 12/29/20 2339   12/29/20 2100  metroNIDAZOLE (FLAGYL) IVPB 500 mg        500 mg 100 mL/hr over 60 Minutes Intravenous  Once 12/29/20 2047 12/29/20 2339              Family Communication/Anticipated D/C date and plan/Code Status   DVT prophylaxis:      Code Status: Full Code  Family Communication: None Disposition Plan:    Status is: Inpatient  Remains inpatient appropriate because:IV treatments appropriate due to intensity of illness or inability to take PO  Dispo: The patient is from: Home              Anticipated d/c is to: Home              Patient currently is not medically stable to d/c.   Difficult to place patient No           Subjective:   C/o nausea,  abdominal pain and profuse nonbloody, watery diarrhea.  No fever or chills.  Objective:    Vitals:   12/30/20 2002 12/30/20 2327 12/31/20 0333 12/31/20 0820  BP: 101/63 106/68 140/68 110/76  Pulse: (!) 119 (!) 110 98 98  Resp: 18 19 16  18  Temp: (!) 100.9 F (38.3 C) 98.8 F (37.1 C) 98.6 F (37 C) 98 F (36.7 C)  TempSrc: Oral Oral Oral Oral  SpO2: 99% 99% 98% 98%  Weight:      Height:       No data found.   Intake/Output Summary (Last 24 hours) at 12/31/2020 1438 Last data filed at 12/30/2020 2000 Gross per 24 hour  Intake 1369.07 ml  Output --  Net 1369.07 ml   Filed Weights   12/29/20 1730 12/29/20 1808  Weight: 121.6 kg 131.6 kg    Exam:  GEN: NAD SKIN: Warm and dry EYES: EOMI ENT: MMM CV: RRR PULM: CTA B ABD: soft, obese, mid abdominal tenderness, no rebound tenderness or guarding, +BS CNS: AAO x 3, non focal EXT: No edema or tenderness         Data Reviewed:   I have personally reviewed following labs and imaging studies:  Labs: Labs show the following:   Basic Metabolic Panel: Recent Labs  Lab 12/29/20 1859 12/30/20 1441 12/31/20 0538  NA 130* 131* 132*  K 3.6 3.5 3.5  CL 102 104 103  CO2 19* 20* 23  GLUCOSE 286* 189* 174*  BUN CREATININE 1.65* 1.15* 0.99  CALCIUM 8.5* 7.7* 7.8*  MG  --   --  1.6*  PHOS  --   --  1.9*   GFR Estimated Creatinine Clearance: 100.9 mL/min (by C-G formula based on SCr of 0.99 mg/dL). Liver Function Tests: Recent Labs  Lab 12/29/20 1859 12/31/20 0538  AST 52* 62*  ALT 48* 50*  ALKPHOS 71 53  BILITOT 0.9 0.8  PROT 7.7 6.9  ALBUMIN 3.3* 2.9*   Recent Labs  Lab 12/29/20 1859  LIPASE 20   No results for input(s): AMMONIA in the last 168 hours. Coagulation profile Recent Labs  Lab 12/30/20 1441  INR 1.3*    CBC: Recent Labs  Lab 12/29/20 1733 12/30/20 1441  WBC 9.5 6.5  HGB 14.6 12.5  HCT 44.4 38.7  MCV 76.7* 76.5*  PLT 277 208   Cardiac Enzymes: No results for  input(s): CKTOTAL, CKMB, CKMBINDEX, TROPONINI in the last 168 hours. BNP (last 3 results) No results for input(s): PROBNP in the last 8760 hours. CBG: Recent Labs  Lab 12/30/20 1158 12/30/20 1634 12/30/20 2258 12/31/20 0840 12/31/20 1152  GLUCAP 204* 141* 142* 157* 179*   D-Dimer: No results for input(s): DDIMER in the last 72 hours. Hgb A1c: Recent Labs    12/30/20 1441  HGBA1C 10.7*   Lipid Profile: No results for input(s): CHOL, HDL, LDLCALC, TRIG, CHOLHDL, LDLDIRECT in the last 72 hours. Thyroid function studies: No results for input(s): TSH, T4TOTAL, T3FREE, THYROIDAB in the last 72 hours.  Invalid input(s): FREET3 Anemia work up: No results for input(s): VITAMINB12, FOLATE, FERRITIN, TIBC, IRON, RETICCTPCT in the last 72 hours. Sepsis Labs: Recent Labs  Lab 12/29/20 1733 12/29/20 1834 12/29/20 2143 12/30/20 1441  WBC 9.5  --   --  6.5  LATICACIDVEN  --  3.8* 2.1*  --     Microbiology Recent Results (from the past 240 hour(s))  Resp Panel by RT-PCR (Flu A&B, Covid) Nasopharyngeal Swab     Status: None   Collection Time: 12/29/20  6:06 PM   Specimen: Nasopharyngeal Swab; Nasopharyngeal(NP) swabs in vial transport medium  Result Value Ref Range Status   SARS Coronavirus 2 by RT PCR NEGATIVE NEGATIVE Final    Comment: (NOTE) SARS-CoV-2 target nucleic acids are  NOT DETECTED.  The SARS-CoV-2 RNA is generally detectable in upper respiratory specimens during the acute phase of infection. The lowest concentration of SARS-CoV-2 viral copies this assay can detect is 138 copies/mL. A negative result does not preclude SARS-Cov-2 infection and should not be used as the sole basis for treatment or other patient management decisions. A negative result may occur with  improper specimen collection/handling, submission of specimen other than nasopharyngeal swab, presence of viral mutation(s) within the areas targeted by this assay, and inadequate number of  viral copies(<138 copies/mL). A negative result must be combined with clinical observations, patient history, and epidemiological information. The expected result is Negative.  Fact Sheet for Patients:  BloggerCourse.com  Fact Sheet for Healthcare Providers:  SeriousBroker.it  This test is no t yet approved or cleared by the [place] FDA and  has been authorized for detection and/or diagnosis of SARS-CoV-2 by FDA under an Emergency Use Authorization (EUA). This EUA will remain  in effect (meaning this test can be used) for the duration of the COVID-19 declaration under Section 564(b)(1) of the Act, 21 U.S.C.section 360bbb-3(b)(1), unless the authorization is terminated  or revoked sooner.       Influenza A by PCR NEGATIVE NEGATIVE Final   Influenza B by PCR NEGATIVE NEGATIVE Final    Comment: (NOTE) The Xpert Xpress SARS-CoV-2/FLU/RSV plus assay is intended as an aid in the diagnosis of influenza from Nasopharyngeal swab specimens and should not be used as a sole basis for treatment. Nasal washings and aspirates are unacceptable for Xpert Xpress SARS-CoV-2/FLU/RSV testing.  Fact Sheet for Patients: BloggerCourse.com  Fact Sheet for Healthcare Providers: SeriousBroker.it  This test is not yet approved or cleared by the [place] FDA and has been authorized for detection and/or diagnosis of SARS-CoV-2 by FDA under an Emergency Use Authorization (EUA). This EUA will remain in effect (meaning this test can be used) for the duration of the COVID-19 declaration under Section 564(b)(1) of the Act, 21 U.S.C. section 360bbb-3(b)(1), unless the authorization is terminated or revoked.  Performed at [facility], [address] [place], [place] [address]   Culture, blood (routine x 2)     Status: None (Preliminary result)   Collection Time: 12/29/20  9:44 PM    Specimen: BLOOD  Result Value Ref Range Status   Specimen Description BLOOD LEFT ASSIST CONTROL  Final   Special Requests   Final    BOTTLES DRAWN AEROBIC AND ANAEROBIC Blood Culture adequate volume   Culture  Setup Time   Final    Organism ID to follow IN BOTH AEROBIC AND ANAEROBIC BOTTLES GRAM POSITIVE COCCI CRITICAL RESULT CALLED TO, READ BACK BY AND VERIFIED WITH: Nash Mantis 12/30/20 1522 KLW Performed at [facility], [address] [place], [place] [address]    Culture GRAM POSITIVE COCCI  Final   Report Status PENDING  Incomplete  Culture, blood (routine x 2)     Status: None (Preliminary result)   Collection Time: 12/29/20  9:44 PM   Specimen: BLOOD  Result Value Ref Range Status   Specimen Description BLOOD RIGHT ASSIST CONTROL  Final   Special Requests   Final    BOTTLES DRAWN AEROBIC AND ANAEROBIC Blood Culture adequate volume   Culture  Setup Time   Final    GRAM POSITIVE COCCI IN CLUSTERS AEROBIC BOTTLE ONLY CRITICAL RESULT CALLED TO, READ BACK BY AND VERIFIED WITH: Nash Mantis 12/30/20 1545 JGF Performed at [facility], [address], [place], [place]  [address]    Culture PENDING  Incomplete   Report Status PENDING  Incomplete  Blood Culture ID Panel (Reflexed)     Status: Abnormal   Collection Time: 12/29/20  9:44 PM  Result Value Ref Range Status   Enterococcus faecalis NOT DETECTED NOT DETECTED Final   Enterococcus Faecium NOT DETECTED NOT DETECTED Final   Listeria monocytogenes NOT DETECTED NOT DETECTED Final   Staphylococcus species DETECTED (A) NOT DETECTED Final    Comment: CRITICAL RESULT CALLED TO, READ BACK BY AND VERIFIED WITH: SAMANTHA RAUAR 12/30/20 1522 KLW    Staphylococcus aureus (BCID) NOT DETECTED NOT DETECTED Final   Staphylococcus epidermidis NOT DETECTED NOT DETECTED Final   Staphylococcus lugdunensis NOT DETECTED NOT DETECTED Final   Streptococcus species NOT DETECTED NOT DETECTED Final   Streptococcus  agalactiae NOT DETECTED NOT DETECTED Final   Streptococcus pneumoniae NOT DETECTED NOT DETECTED Final   Streptococcus pyogenes NOT DETECTED NOT DETECTED Final   A.calcoaceticus-baumannii NOT DETECTED NOT DETECTED Final   Bacteroides fragilis NOT DETECTED NOT DETECTED Final   Enterobacterales NOT DETECTED NOT DETECTED Final   Enterobacter cloacae complex NOT DETECTED NOT DETECTED Final   Escherichia coli NOT DETECTED NOT DETECTED Final   Klebsiella aerogenes NOT DETECTED NOT DETECTED Final   Klebsiella oxytoca NOT DETECTED NOT DETECTED Final   Klebsiella pneumoniae NOT DETECTED NOT DETECTED Final   Proteus species NOT DETECTED NOT DETECTED Final   Salmonella species NOT DETECTED NOT DETECTED Final   Serratia marcescens NOT DETECTED NOT DETECTED Final   Haemophilus influenzae NOT DETECTED NOT DETECTED Final   Neisseria meningitidis NOT DETECTED NOT DETECTED Final   Pseudomonas aeruginosa NOT DETECTED NOT DETECTED Final   Stenotrophomonas maltophilia NOT DETECTED NOT DETECTED Final   Candida albicans NOT DETECTED NOT DETECTED Final   Candida auris NOT DETECTED NOT DETECTED Final   Candida glabrata NOT DETECTED NOT DETECTED Final   Candida krusei NOT DETECTED NOT DETECTED Final   Candida parapsilosis NOT DETECTED NOT DETECTED Final   Candida tropicalis NOT DETECTED NOT DETECTED Final   Cryptococcus neoformans/gattii NOT DETECTED NOT DETECTED Final    Comment: Performed at [facility], [address] [place], [place] [address]  C Difficile Quick Screen w PCR reflex     Status: None   Collection Time: 12/30/20  6:19 AM   Specimen: Stool  Result Value Ref Range Status   C Diff antigen NEGATIVE NEGATIVE Final   C Diff toxin NEGATIVE NEGATIVE Final   C Diff interpretation No C. difficile detected.  Final    Comment: Performed at [facility], [address] [place], [place] [address]  Gastrointestinal Panel by PCR , Stool     Status: Abnormal   Collection Time:  12/30/20  6:19 AM   Specimen: Stool  Result Value Ref Range Status   Campylobacter species NOT DETECTED NOT DETECTED Final   Plesimonas shigelloides NOT DETECTED NOT DETECTED Final   Salmonella species DETECTED (A) NOT DETECTED Final    Comment: RESULT CALLED TO, READ BACK BY AND VERIFIED WITH: C.GUALDONI,RN [id number] 12/30/20 GM    Yersinia enterocolitica NOT DETECTED NOT DETECTED Final   Vibrio species NOT DETECTED NOT DETECTED Final   Vibrio cholerae NOT DETECTED NOT DETECTED Final   Enteroaggregative E coli (EAEC) NOT DETECTED NOT DETECTED Final   Enteropathogenic E coli (EPEC) NOT DETECTED NOT DETECTED Final   Enterotoxigenic E coli (ETEC) NOT DETECTED NOT DETECTED Final   Shiga like toxin producing E coli (STEC) NOT DETECTED NOT DETECTED  Final   Shigella/Enteroinvasive E coli (EIEC) NOT DETECTED NOT DETECTED Final   Cryptosporidium NOT DETECTED NOT DETECTED Final   Cyclospora cayetanensis NOT DETECTED NOT DETECTED Final   Entamoeba histolytica NOT DETECTED NOT DETECTED Final   Giardia lamblia NOT DETECTED NOT DETECTED Final   Adenovirus F40/41 NOT DETECTED NOT DETECTED Final   Astrovirus NOT DETECTED NOT DETECTED Final   Norovirus GI/GII NOT DETECTED NOT DETECTED Final   Rotavirus A NOT DETECTED NOT DETECTED Final   Sapovirus (I, II, IV, and V) NOT DETECTED NOT DETECTED Final    Comment: Performed at [facility], [address] [place], [place] [address]  Urine Culture     Status: Abnormal   Collection Time: 12/30/20  6:23 AM   Specimen: Urine, Random  Result Value Ref Range Status   Specimen Description   Final    URINE, RANDOM Performed at [facility], [address] [place], [place] [address]    Special Requests   Final    NONE Performed at [facility], [address], [place], [place] [address]    Culture (A)  Final    <10,000 COLONIES/mL INSIGNIFICANT GROWTH Performed at [facility], [address] [place], [place] [address]     Report Status 12/31/2020 FINAL  Final  CULTURE, BLOOD (ROUTINE X 2) w Reflex to ID Panel     Status: None (Preliminary result)   Collection Time: 12/30/20  5:11 PM   Specimen: BLOOD  Result Value Ref Range Status   Specimen Description BLOOD RIGHT ANTECUBITAL  Final   Special Requests   Final    BOTTLES DRAWN AEROBIC AND ANAEROBIC Blood Culture adequate volume   Culture   Final    NO GROWTH < 12 HOURS Performed at [facility], [address] [place], [place] [address]    Report Status PENDING  Incomplete  CULTURE, BLOOD (ROUTINE X 2) w Reflex to ID Panel     Status: None (Preliminary result)   Collection Time: 12/30/20  7:06 PM   Specimen: BLOOD  Result Value Ref Range Status   Specimen Description BLOOD BLOOD LEFT HAND  Final   Special Requests   Final    BOTTLES DRAWN AEROBIC AND ANAEROBIC Blood Culture results may not be optimal due to an inadequate volume of blood received in culture bottles   Culture   Final    NO GROWTH < 12 HOURS Performed at [facility], [address] [place], [place] [address]    Report Status PENDING  Incomplete    Procedures and diagnostic studies:  CT ABDOMEN PELVIS WO CONTRAST  Result Date: 12/29/2020 CLINICAL DATA:  Abdominal pain, vomiting and diarrhea for 2 days. Diverticulitis suspected. EXAM: CT ABDOMEN AND PELVIS WITHOUT CONTRAST TECHNIQUE: Multidetector CT imaging of the abdomen and pelvis was performed following the standard protocol without IV contrast. COMPARISON:  None. FINDINGS: Lower chest: No acute airspace disease or pleural effusion. Hepatobiliary: The liver is enlarged spanning 20 cm cranial caudal. Advanced hepatic steatosis. There is no discrete focal lesion. High-density material in the gallbladder may represent sludge or noncalcified stones. No pericholecystic inflammation or abnormal distention. No common bile duct dilatation. Pancreas: No ductal dilatation or inflammation. Spleen: Normal in size without  focal abnormality. Splenule anteriorly. Adrenals/Urinary Tract: Normal adrenal glands. No hydronephrosis or renal calculi. No perinephric edema. No evidence of focal renal lesion on this noncontrast exam. Partially distended urinary bladder. No bladder wall thickening. Stomach/Bowel: Patulous distal esophagus. Nondistended stomach. Tiny  intramural lipoma in the duodenum, nonobstructing and of no clinical significance. Decompressed proximal small bowel. Distal small bowel loops are fluid-filled and mildly prominent, no transition point. Normal appendix. Diffuse pericolonic fat stranding and liquid stool throughout the colon. Wall thickening involving the cecum and proximal ascending colon. No bowel pneumatosis. No significant diverticular disease. Vascular/Lymphatic: Small ileocolic lymph nodes are likely reactive, but nonspecific. There is scattered central mesenteric and retroperitoneal nodes, not enlarged by size criteria. Normal caliber abdominal aorta. No portal venous or mesenteric gas. Reproductive: Uterus and bilateral adnexa are unremarkable. Other: No ascites or free air. No focal fluid collection. Small fat containing umbilical hernia. Musculoskeletal: There are no acute or suspicious osseous abnormalities. IMPRESSION: 1. Diffuse pericolonic fat stranding and liquid stool throughout the colon consistent with colitis. Wall thickening involving the cecum and proximal ascending colon 2. Hepatomegaly and advanced hepatic steatosis. 3. High-density material in the gallbladder may represent sludge or noncalcified stones. No pericholecystic inflammation or abnormal distention. Electronically Signed   By: Narda RutherfordMelanie  Sanford M.D.   On: 12/29/2020 20:05   DG Chest Port 1 View  Result Date: 12/29/2020 CLINICAL DATA:  Pt comes into the ED via POV c.o generalized abd pain, emesis, and diarrhea x 2 days. Pt in NAD at this time with even and unlabored respirations.pt tachycardic in ER roomtachycardia EXAM: PORTABLE  CHEST 1 VIEW COMPARISON:  None. FINDINGS: Normal mediastinum and cardiac silhouette. Normal pulmonary vasculature. No evidence of effusion, infiltrate, or pneumothorax. No acute bony abnormality. Anterior cervical fusion. IMPRESSION: No acute cardiopulmonary process. Electronically Signed   By: Genevive BiStewart  Edmunds M.D.   On: 12/29/2020 18:55               LOS: 2 days   Huxley Shurley  [facility]   Pager on [email or web address]. If 7PM-7AM, please contact night-coverage at [email or web address]     12/31/2020, 2:38 PM

## 2020-12-31 NOTE — Progress Notes (Signed)
Date of Admission:  12/29/2020    Subjective: Has diarrhea Better than before , but present and bothersome Says it got better after a dose of imodium No nausea or vomiting Has abdominal pain No fever or chills  Medications:   DULoxetine  20 mg Oral BID   enoxaparin (LOVENOX) injection  0.5 mg/kg Subcutaneous Q24H   gabapentin  900 mg Oral TID   insulin aspart  0-15 Units Subcutaneous TID WC   insulin glargine  10 Units Subcutaneous Daily   mometasone-formoterol  2 puff Inhalation BID   pantoprazole  40 mg Oral Daily   phosphorus  500 mg Oral TID   tiotropium  18 mcg Inhalation Daily   traZODone  50 mg Oral QHS    Objective: Vital signs in last 24 hours: Temp:  [98 F (36.7 C)-100.9 F (38.3 C)] 98 F (36.7 C) (06/24 0820) Pulse Rate:  [98-119] 98 (06/24 0820) Resp:  [16-22] 18 (06/24 0820) BP: (101-140)/(63-89) 110/76 (06/24 0820) SpO2:  [97 %-99 %] 98 % (06/24 0820)  PHYSICAL EXAM:  General: awake  cooperative, some distress appears stated age.  Head: Normocephalic, without obvious abnormality, atraumatic. Eyes: Conjunctivae clear, anicteric sclerae. Pupils are equal ENT Nares normal. No drainage or sinus tenderness. Lips, mucosa, and tongue normal. No Thrush Neck: Supple, symmetrical, no adenopathy, thyroid: non tender no carotid bruit and no JVD. Back: No CVA tenderness. Lungs: Clear to auscultation bilaterally. No Wheezing or Rhonchi. No rales. Heart: Regular rate and rhythm, no murmur, rub or gallop. Abdomen: Soft,  distended. Bowel sounds normal. No masses Extremities: atraumatic, no cyanosis. No edema. No clubbing Skin: No rashes or lesions. Or bruising Lymph: Cervical, supraclavicular normal. Neurologic: Grossly non-focal  Lab Results Recent Labs    12/29/20 1733 12/29/20 1859 12/30/20 1441 12/31/20 0538  WBC 9.5  --  6.5  --   HGB 14.6  --  12.5  --   HCT 44.4  --  38.7  --   NA  --    < > 131* 132*  K  --    < > 3.5 3.5  CL  --    < > 104  103  CO2  --    < > 20* 23  BUN  --    < > 15 12  CREATININE  --    < > 1.15* 0.99   < > = values in this interval not displayed.   Liver Panel Recent Labs    12/29/20 1859 12/31/20 0538  PROT 7.7 6.9  ALBUMIN 3.3* 2.9*  AST 52* 62*  ALT 48* 50*  ALKPHOS 71 53  BILITOT 0.9 0.8   Sedimentation Rate No results for input(s): ESRSEDRATE in the last 72 hours. C-Reactive Protein No results for input(s): CRP in the last 72 hours.  Microbiology:  Studies/Results: CT ABDOMEN PELVIS WO CONTRAST  Result Date: 12/29/2020 CLINICAL DATA:  Abdominal pain, vomiting and diarrhea for 2 days. Diverticulitis suspected. EXAM: CT ABDOMEN AND PELVIS WITHOUT CONTRAST TECHNIQUE: Multidetector CT imaging of the abdomen and pelvis was performed following the standard protocol without IV contrast. COMPARISON:  None. FINDINGS: Lower chest: No acute airspace disease or pleural effusion. Hepatobiliary: The liver is enlarged spanning 20 cm cranial caudal. Advanced hepatic steatosis. There is no discrete focal lesion. High-density material in the gallbladder may represent sludge or noncalcified stones. No pericholecystic inflammation or abnormal distention. No common bile duct dilatation. Pancreas: No ductal dilatation or inflammation. Spleen: Normal in size without focal abnormality. Splenule anteriorly. Adrenals/Urinary  Tract: Normal adrenal glands. No hydronephrosis or renal calculi. No perinephric edema. No evidence of focal renal lesion on this noncontrast exam. Partially distended urinary bladder. No bladder wall thickening. Stomach/Bowel: Patulous distal esophagus. Nondistended stomach. Tiny intramural lipoma in the duodenum, nonobstructing and of no clinical significance. Decompressed proximal small bowel. Distal small bowel loops are fluid-filled and mildly prominent, no transition point. Normal appendix. Diffuse pericolonic fat stranding and liquid stool throughout the colon. Wall thickening involving the cecum  and proximal ascending colon. No bowel pneumatosis. No significant diverticular disease. Vascular/Lymphatic: Small ileocolic lymph nodes are likely reactive, but nonspecific. There is scattered central mesenteric and retroperitoneal nodes, not enlarged by size criteria. Normal caliber abdominal aorta. No portal venous or mesenteric gas. Reproductive: Uterus and bilateral adnexa are unremarkable. Other: No ascites or free air. No focal fluid collection. Small fat containing umbilical hernia. Musculoskeletal: There are no acute or suspicious osseous abnormalities. IMPRESSION: 1. Diffuse pericolonic fat stranding and liquid stool throughout the colon consistent with colitis. Wall thickening involving the cecum and proximal ascending colon 2. Hepatomegaly and advanced hepatic steatosis. 3. High-density material in the gallbladder may represent sludge or noncalcified stones. No pericholecystic inflammation or abnormal distention. Electronically Signed   By: Narda Rutherford M.D.   On: 12/29/2020 20:05   DG Chest Port 1 View  Result Date: 12/29/2020 CLINICAL DATA:  Pt comes into the ED via POV c.o generalized abd pain, emesis, and diarrhea x 2 days. Pt in NAD at this time with even and unlabored respirations.pt tachycardic in ER roomtachycardia EXAM: PORTABLE CHEST 1 VIEW COMPARISON:  None. FINDINGS: Normal mediastinum and cardiac silhouette. Normal pulmonary vasculature. No evidence of effusion, infiltrate, or pneumothorax. No acute bony abnormality. Anterior cervical fusion. IMPRESSION: No acute cardiopulmonary process. Electronically Signed   By: Genevive Bi M.D.   On: 12/29/2020 18:55     Assessment/Plan:  Gastroenteritis due to salmonella- on ciprofloxacin May need to give until diarrhea improves- 5 days??  Gram positive cocci in blood culture- ID as staph species- culture pending Continue vanco IV If the culture shows same  kind of staph in both sets then will need treatment If it shows 2 kinds of  staph it could be contaminant and then will not need vanco   AKI -improved  COPD  Fibromyalgia  ID will follow her peripherally this weekend. Call if needed

## 2020-12-31 NOTE — Progress Notes (Signed)
*  PRELIMINARY RESULTS* Echocardiogram 2D Echocardiogram has been performed.  Priscilla Moore 12/31/2020, 2:29 PM

## 2020-12-31 NOTE — Progress Notes (Signed)
Pharmacy Antibiotic Note  Priscilla Moore is a 45 y.o. female  with medical history significant for type 2 diabetes mellitus, hyperlipidemia, hypertension, fibromyalgia, bipolar disorder, tachycardia, who presented to the hospital with a 2-day history of vomiting, watery nonbloody diarrhea, nausea and abdominal pain. Pharmacy has been consulted for vancomycin dosing for bacteremia. Pt also on ciprofloxacin and metronidazole. GI PCR with salmonella. Her renal function has significantly approved since admission.  6/23 BCID: 3/4 bottles (2 aerobic and 1 anaerobic), GPC - staph species  Plan: adjust vancomycin dose to 1000 mg IV every 12 hours Est AUC: 481 Css (calculated): 28.6 / 14.3 mcg/mL Ke: 0.063 h-1, T1/2: 11.1 h Daily renal function assessment while on IV vancomycin Obtain vancomycin level around 4th or 5th dose if continued Follow up repeat blood cultures    Height: 5\' 8"  (172.7 cm) Weight: 131.6 kg (290 lb 2 oz) IBW/kg (Calculated) : 63.9  Temp (24hrs), Avg:99.4 F (37.4 C), Min:98.6 F (37 C), Max:100.9 F (38.3 C)  Recent Labs  Lab 12/29/20 1733 12/29/20 1834 12/29/20 1859 12/29/20 2143 12/30/20 1441 12/31/20 0538  WBC 9.5  --   --   --  6.5  --   CREATININE  --   --  1.65*  --  1.15* 0.99  LATICACIDVEN  --  3.8*  --  2.1*  --   --      Estimated Creatinine Clearance: 100.9 mL/min (by C-G formula based on SCr of 0.99 mg/dL).    Allergies  Allergen Reactions   Aripiprazole Other (See Comments)    Suicidal thoughts Suicidal thoughts     Antimicrobials this admission: 6/22 metronidazole >> 6/23 6/22 ciprofloxacin >>  6/23 vancomycin >>  Microbiology results: 6/23 Bcx NG < 12 hours 6/22 BCx: 3/4 bottles GPC, staph species 6/23 UCx: < 10k CFU 6/23 GI panel: salmonella   Thank you for allowing pharmacy to be a part of this patient's care.  7/23, PharmD 12/31/2020 7:07 AM

## 2021-01-01 LAB — GLUCOSE, CAPILLARY
Glucose-Capillary: 150 mg/dL — ABNORMAL HIGH (ref 70–99)
Glucose-Capillary: 151 mg/dL — ABNORMAL HIGH (ref 70–99)
Glucose-Capillary: 174 mg/dL — ABNORMAL HIGH (ref 70–99)
Glucose-Capillary: 178 mg/dL — ABNORMAL HIGH (ref 70–99)

## 2021-01-01 LAB — BASIC METABOLIC PANEL
Anion gap: 7 (ref 5–15)
BUN: 5 mg/dL — ABNORMAL LOW (ref 6–20)
CO2: 24 mmol/L (ref 22–32)
Calcium: 7.5 mg/dL — ABNORMAL LOW (ref 8.9–10.3)
Chloride: 104 mmol/L (ref 98–111)
Creatinine, Ser: 0.87 mg/dL (ref 0.44–1.00)
GFR, Estimated: >60 mL/min (ref >60)
Glucose, Bld: 134 mg/dL — ABNORMAL HIGH (ref 70–99)
Potassium: 3.3 mmol/L — ABNORMAL LOW (ref 3.5–5.1)
Sodium: 135 mmol/L (ref 135–145)

## 2021-01-01 LAB — MAGNESIUM: Magnesium: 2.1 mg/dL (ref 1.7–2.4)

## 2021-01-01 LAB — PHOSPHORUS: Phosphorus: 2.1 mg/dL — ABNORMAL LOW (ref 2.5–4.6)

## 2021-01-01 MED ORDER — VANCOMYCIN HCL 1000 MG/200ML IV SOLN: 1000.0000 mg | Freq: Two times a day (BID) | INTRAVENOUS | Status: DC

## 2021-01-01 MED ORDER — K PHOS MONO-SOD PHOS DI & MONO 155-852-130 MG PO TABS
500.0000 mg | ORAL_TABLET | Freq: Three times a day (TID) | ORAL | Status: AC
  Administered 2021-01-01 (×3): 500 mg via ORAL
  Filled 2021-01-01 (×3): qty 2

## 2021-01-01 MED ORDER — POTASSIUM CHLORIDE CRYS ER 20 MEQ PO TBCR
40.0000 meq | EXTENDED_RELEASE_TABLET | ORAL | Status: AC
  Administered 2021-01-01 (×2): 40 meq via ORAL
  Filled 2021-01-01 (×2): qty 2

## 2021-01-01 NOTE — Progress Notes (Addendum)
Progress Note    Priscilla Moore  EXN:[id number] DOB: 08/08/1975  DOA: 12/29/2020 PCP: [facility]      Brief Narrative:    Medical records reviewed and are as summarized below:  Priscilla Moore is a 45 y.o. female with medical history significant for type 2 diabetes mellitus, hyperlipidemia, hypertension, fibromyalgia, bipolar disorder, tachycardia, who presented to the hospital with a 2-day history of vomiting, watery nonbloody diarrhea, nausea and abdominal pain.  She was admitted to the hospital for severe sepsis secondary to acute colitis complicated by AKI.  GI panel showed Salmonella.  Stool for C. difficile toxin was negative.  Blood culture showed gram-positive cocci in clusters.  She was treated with analgesics, IV fluids and empiric IV antibiotics.    Assessment/Plan:   Active Problems:   Bipolar 1 disorder (HCC)   Type 2 diabetes mellitus with hyperglycemia, without long-term current use of insulin (HCC)   Fibromyalgia   Colitis   Severe sepsis (HCC)   Abdominal pain, vomiting, and diarrhea   AKI (acute kidney injury) (HCC)   Dyslipidemia   COPD (chronic obstructive pulmonary disease) (HCC)   Body mass index is 44.11 kg/m.  (Morbid obesity)  Severe sepsis secondary to acute Salmonella gastroenteritis/colitis, severe diarrhea, gram-positive cocci bacteremia: Continue IV Cipro and IV vancomycin.  Discontinue IV fluids.  Continue Imodium as needed for diarrhea.  No evidence of vegetations on 2D echo but there is evidence of grade 2 diastolic dysfunction.  Sensitivity report is still pending.  No growth on repeat blood cultures thus far.  AKI: Improved.    Hypomagnesemia, hypokalemia and hypophosphatemia: Replete potassium and phosphorus.  Magnesium level has improved  Abnormal urinalysis but no growth on urine culture.  No UTI.  Type II DM with hyperglycemia: Continue Lantus and NovoLog.  COPD: Stable.  Continue  bronchodilators.  Elevated liver enzymes: This is likely from hepatic steatosis.  Other comorbidities include hepatic steatosis, fibromyalgia, history of tachycardia  Possible discharge home tomorrow.  Diet Order             Diet regular Room service appropriate? Yes; Fluid consistency: Thin  Diet effective now                      Consultants: None  Procedures: None    Medications:    DULoxetine  20 mg Oral BID   enoxaparin (LOVENOX) injection  0.5 mg/kg Subcutaneous Q24H   gabapentin  900 mg Oral TID   insulin aspart  0-15 Units Subcutaneous TID WC   insulin glargine  10 Units Subcutaneous Daily   mometasone-formoterol  2 puff Inhalation BID   pantoprazole  40 mg Oral Daily   phosphorus  500 mg Oral TID   tiotropium  18 mcg Inhalation Daily   traZODone  50 mg Oral QHS   Continuous Infusions:  ciprofloxacin 400 mg (01/01/21 0158)   vancomycin 1,000 mg (01/01/21 1215)     Anti-infectives (From admission, onward)    Start     Dose/Rate Route Frequency Ordered Stop   12/31/20 2200  vancomycin (VANCOREADY) IVPB 1750 mg/350 mL  Status:  Discontinued        1,750 mg 175 mL/hr over 120 Minutes Intravenous Every 24 hours 12/30/20 1954 12/31/20 0744   12/31/20 1800  vancomycin (VANCOREADY) IVPB 1750 mg/350 mL  Status:  Discontinued        1,750 mg 175 mL/hr over 120 Minutes Intravenous Every 24 hours 12/30/20 1626 12/30/20 1954  12/31/20 1200  vancomycin (VANCOREADY) IVPB 1000 mg/200 mL  Status:  Discontinued        1,000 mg 200 mL/hr over 60 Minutes Intravenous Every 12 hours 12/31/20 0744 12/31/20 0814   12/31/20 1200  vancomycin (VANCOCIN) IVPB 1000 mg/200 mL premix        1,000 mg 200 mL/hr over 60 Minutes Intravenous Every 12 hours 12/31/20 0814     12/30/20 2200  vancomycin (VANCOCIN) IVPB 1000 mg/200 mL premix        1,000 mg 200 mL/hr over 60 Minutes Intravenous  Once 12/30/20 1953 12/31/20 0022   12/30/20 2000  vancomycin (VANCOREADY) IVPB 1500  mg/300 mL        1,500 mg 150 mL/hr over 120 Minutes Intravenous  Once 12/30/20 1953 12/30/20 2216   12/30/20 1830  vancomycin (VANCOCIN) IVPB 1000 mg/200 mL premix  Status:  Discontinued        1,000 mg 200 mL/hr over 60 Minutes Intravenous  Once 12/30/20 1633 12/30/20 1953   12/30/20 1715  vancomycin (VANCOREADY) IVPB 1000 mg/200 mL  Status:  Discontinued        1,000 mg 200 mL/hr over 60 Minutes Intravenous  Once 12/30/20 1622 12/30/20 1632   12/30/20 1715  vancomycin (VANCOREADY) IVPB 1500 mg/300 mL  Status:  Discontinued        1,500 mg 150 mL/hr over 120 Minutes Intravenous  Once 12/30/20 1624 12/30/20 1953   12/30/20 1630  vancomycin (VANCOCIN) 2,500 mg in sodium chloride 0.9 % 500 mL IVPB  Status:  Discontinued        2,500 mg 250 mL/hr over 120 Minutes Intravenous  Once 12/30/20 1620 12/30/20 1622   12/30/20 0110  ciprofloxacin (CIPRO) IVPB 400 mg        400 mg 200 mL/hr over 60 Minutes Intravenous Every 12 hours 12/30/20 0110     12/30/20 0110  metroNIDAZOLE (FLAGYL) IVPB 500 mg  Status:  Discontinued        500 mg 100 mL/hr over 60 Minutes Intravenous Every 8 hours 12/30/20 0110 12/30/20 1631   12/29/20 2100  ciprofloxacin (CIPRO) IVPB 400 mg        400 mg 200 mL/hr over 60 Minutes Intravenous  Once 12/29/20 2047 12/29/20 2339   12/29/20 2100  metroNIDAZOLE (FLAGYL) IVPB 500 mg        500 mg 100 mL/hr over 60 Minutes Intravenous  Once 12/29/20 2047 12/29/20 2339              Family Communication/Anticipated D/C date and plan/Code Status   DVT prophylaxis:      Code Status: Full Code  Family Communication: None Disposition Plan:    Status is: Inpatient  Remains inpatient appropriate because:IV treatments appropriate due to intensity of illness or inability to take PO  Dispo: The patient is from: Home              Anticipated d/c is to: Home              Patient currently is not medically stable to d/c.   Difficult to place patient  No           Subjective:   She complains of abdominal pain and watery diarrhea.  She thinks diarrhea is improving.  She had about 3-4 watery stools this morning.  Imodium is helping a little bit.  Her nurse was at the bedside.  Objective:    Vitals:   01/01/21 0046 01/01/21 0401 01/01/21 0803 01/01/21 1140  BP:  136/84 110/76 118/70 132/88  Pulse: 96 85 86 91  Resp: Temp: 97.9 F (36.6 C) 98 F (36.7 C) 97.9 F (36.6 C) 98.4 F (36.9 C)  TempSrc: Oral Oral Oral Oral  SpO2: 99% 97% 100% 100%  Weight:      Height:       No data found.   Intake/Output Summary (Last 24 hours) at 01/01/2021 1222 Last data filed at 01/01/2021 0304 Gross per 24 hour  Intake 3785.8 ml  Output 1000 ml  Net 2785.8 ml   Filed Weights   12/29/20 1730 12/29/20 1808  Weight: 121.6 kg 131.6 kg    Exam:  GEN: NAD SKIN: No rash EYES: EOMI ENT: MMM CV: RRR PULM: CTA B ABD: soft, obese, mild tenderness in the periumbilical area, no rebound tenderness or guarding, +BS CNS: AAO x 3, non focal EXT: No edema or tenderness           Data Reviewed:   I have personally reviewed following labs and imaging studies:  Labs: Labs show the following:   Basic Metabolic Panel: Recent Labs  Lab 12/29/20 1859 12/30/20 1441 12/31/20 0538 01/01/21 0647  NA 130* 131* 132* 135  K 3.6 3.5 3.5 3.3*  CL 102 104 103 104  CO2 19* 20* 23 24  GLUCOSE 286* 189* 174* 134*  BUN 5*  CREATININE 1.65* 1.15* 0.99 0.87  CALCIUM 8.5* 7.7* 7.8* 7.5*  MG  --   --  1.6* 2.1  PHOS  --   --  1.9* 2.1*   GFR Estimated Creatinine Clearance: 114.8 mL/min (by C-G formula based on SCr of 0.87 mg/dL). Liver Function Tests: Recent Labs  Lab 12/29/20 1859 12/31/20 0538  AST 52* 62*  ALT 48* 50*  ALKPHOS 71 53  BILITOT 0.9 0.8  PROT 7.7 6.9  ALBUMIN 3.3* 2.9*   Recent Labs  Lab 12/29/20 1859  LIPASE 20   No results for input(s): AMMONIA in the last 168 hours. Coagulation  profile Recent Labs  Lab 12/30/20 1441  INR 1.3*    CBC: Recent Labs  Lab 12/29/20 1733 12/30/20 1441  WBC 9.5 6.5  HGB 14.6 12.5  HCT 44.4 38.7  MCV 76.7* 76.5*  PLT 277 208   Cardiac Enzymes: No results for input(s): CKTOTAL, CKMB, CKMBINDEX, TROPONINI in the last 168 hours. BNP (last 3 results) No results for input(s): PROBNP in the last 8760 hours. CBG: Recent Labs  Lab 12/31/20 1152 12/31/20 1654 12/31/20 2106 01/01/21 0758 01/01/21 1141  GLUCAP 179* 149* 143* 150* 151*   D-Dimer: No results for input(s): DDIMER in the last 72 hours. Hgb A1c: Recent Labs    12/30/20 1441  HGBA1C 10.7*   Lipid Profile: No results for input(s): CHOL, HDL, LDLCALC, TRIG, CHOLHDL, LDLDIRECT in the last 72 hours. Thyroid function studies: No results for input(s): TSH, T4TOTAL, T3FREE, THYROIDAB in the last 72 hours.  Invalid input(s): FREET3 Anemia work up: No results for input(s): VITAMINB12, FOLATE, FERRITIN, TIBC, IRON, RETICCTPCT in the last 72 hours. Sepsis Labs: Recent Labs  Lab 12/29/20 1733 12/29/20 1834 12/29/20 2143 12/30/20 1441  WBC 9.5  --   --  6.5  LATICACIDVEN  --  3.8* 2.1*  --     Microbiology Recent Results (from the past 240 hour(s))  Resp Panel by RT-PCR (Flu A&B, Covid) Nasopharyngeal Swab     Status: None   Collection Time: 12/29/20  6:06 PM   Specimen: Nasopharyngeal Swab; Nasopharyngeal(NP) swabs  in vial transport medium  Result Value Ref Range Status   SARS Coronavirus 2 by RT PCR NEGATIVE NEGATIVE Final    Comment: (NOTE) SARS-CoV-2 target nucleic acids are NOT DETECTED.  The SARS-CoV-2 RNA is generally detectable in upper respiratory specimens during the acute phase of infection. The lowest concentration of SARS-CoV-2 viral copies this assay can detect is 138 copies/mL. A negative result does not preclude SARS-Cov-2 infection and should not be used as the sole basis for treatment or other patient management decisions. A negative  result may occur with  improper specimen collection/handling, submission of specimen other than nasopharyngeal swab, presence of viral mutation(s) within the areas targeted by this assay, and inadequate number of viral copies(<138 copies/mL). A negative result must be combined with clinical observations, patient history, and epidemiological information. The expected result is Negative.  Fact Sheet for Patients:  BloggerCourse.com  Fact Sheet for Healthcare Providers:  SeriousBroker.it  This test is no t yet approved or cleared by the [place] FDA and  has been authorized for detection and/or diagnosis of SARS-CoV-2 by FDA under an Emergency Use Authorization (EUA). This EUA will remain  in effect (meaning this test can be used) for the duration of the COVID-19 declaration under Section 564(b)(1) of the Act, 21 U.S.C.section 360bbb-3(b)(1), unless the authorization is terminated  or revoked sooner.       Influenza A by PCR NEGATIVE NEGATIVE Final   Influenza B by PCR NEGATIVE NEGATIVE Final    Comment: (NOTE) The Xpert Xpress SARS-CoV-2/FLU/RSV plus assay is intended as an aid in the diagnosis of influenza from Nasopharyngeal swab specimens and should not be used as a sole basis for treatment. Nasal washings and aspirates are unacceptable for Xpert Xpress SARS-CoV-2/FLU/RSV testing.  Fact Sheet for Patients: BloggerCourse.com  Fact Sheet for Healthcare Providers: SeriousBroker.it  This test is not yet approved or cleared by the [place] FDA and has been authorized for detection and/or diagnosis of SARS-CoV-2 by FDA under an Emergency Use Authorization (EUA). This EUA will remain in effect (meaning this test can be used) for the duration of the COVID-19 declaration under Section 564(b)(1) of the Act, 21 U.S.C. section 360bbb-3(b)(1), unless the authorization is  terminated or revoked.  Performed at [facility], [address] [place], [place] [address]   Culture, blood (routine x 2)     Status: None (Preliminary result)   Collection Time: 12/29/20  9:44 PM   Specimen: BLOOD  Result Value Ref Range Status   Specimen Description   Final    BLOOD LEFT ASSIST CONTROL Performed at [facility], [address], [place], [place] [address]    Special Requests   Final    BOTTLES DRAWN AEROBIC AND ANAEROBIC Blood Culture adequate volume Performed at [facility], [address] [place], [place] [address]    Culture  Setup Time   Final    IN BOTH AEROBIC AND ANAEROBIC BOTTLES GRAM POSITIVE COCCI CRITICAL RESULT CALLED TO, READ BACK BY AND VERIFIED WITH: Nash Mantis 12/30/20 1522 KLW    Culture   Final    GRAM POSITIVE COCCI IDENTIFICATION TO FOLLOW Performed at [facility], [address] [place], [place] [address]    Report Status PENDING  Incomplete  Culture, blood (routine x 2)     Status: None (Preliminary result)   Collection Time: 12/29/20  9:44 PM   Specimen: BLOOD  Result Value Ref Range Status   Specimen Description   Final  BLOOD RIGHT ASSIST CONTROL Performed at [facility], [address] [place], [place] [address]    Special Requests   Final    BOTTLES DRAWN AEROBIC AND ANAEROBIC Blood Culture adequate volume Performed at [facility], [address] [place], [place] [address]    Culture  Setup Time   Final    GRAM POSITIVE COCCI IN CLUSTERS AEROBIC BOTTLE ONLY CRITICAL RESULT CALLED TO, READ BACK BY AND VERIFIED WITH: Nash Mantis 12/30/20 1545 JGF Performed at [facility], [address] [place], [place] [address]    Culture   Final    Romie Minus POSITIVE COCCI IDENTIFICATION TO FOLLOW Performed at [facility], [address], [place], [place] [address]    Report Status PENDING  Incomplete  Blood Culture ID Panel (Reflexed)     Status:  Abnormal   Collection Time: 12/29/20  9:44 PM  Result Value Ref Range Status   Enterococcus faecalis NOT DETECTED NOT DETECTED Final   Enterococcus Faecium NOT DETECTED NOT DETECTED Final   Listeria monocytogenes NOT DETECTED NOT DETECTED Final   Staphylococcus species DETECTED (A) NOT DETECTED Final    Comment: CRITICAL RESULT CALLED TO, READ BACK BY AND VERIFIED WITH: SAMANTHA RAUAR 12/30/20 1522 KLW    Staphylococcus aureus (BCID) NOT DETECTED NOT DETECTED Final   Staphylococcus epidermidis NOT DETECTED NOT DETECTED Final   Staphylococcus lugdunensis NOT DETECTED NOT DETECTED Final   Streptococcus species NOT DETECTED NOT DETECTED Final   Streptococcus agalactiae NOT DETECTED NOT DETECTED Final   Streptococcus pneumoniae NOT DETECTED NOT DETECTED Final   Streptococcus pyogenes NOT DETECTED NOT DETECTED Final   A.calcoaceticus-baumannii NOT DETECTED NOT DETECTED Final   Bacteroides fragilis NOT DETECTED NOT DETECTED Final   Enterobacterales NOT DETECTED NOT DETECTED Final   Enterobacter cloacae complex NOT DETECTED NOT DETECTED Final   Escherichia coli NOT DETECTED NOT DETECTED Final   Klebsiella aerogenes NOT DETECTED NOT DETECTED Final   Klebsiella oxytoca NOT DETECTED NOT DETECTED Final   Klebsiella pneumoniae NOT DETECTED NOT DETECTED Final   Proteus species NOT DETECTED NOT DETECTED Final   Salmonella species NOT DETECTED NOT DETECTED Final   Serratia marcescens NOT DETECTED NOT DETECTED Final   Haemophilus influenzae NOT DETECTED NOT DETECTED Final   Neisseria meningitidis NOT DETECTED NOT DETECTED Final   Pseudomonas aeruginosa NOT DETECTED NOT DETECTED Final   Stenotrophomonas maltophilia NOT DETECTED NOT DETECTED Final   Candida albicans NOT DETECTED NOT DETECTED Final   Candida auris NOT DETECTED NOT DETECTED Final   Candida glabrata NOT DETECTED NOT DETECTED Final   Candida krusei NOT DETECTED NOT DETECTED Final   Candida parapsilosis NOT DETECTED NOT DETECTED Final    Candida tropicalis NOT DETECTED NOT DETECTED Final   Cryptococcus neoformans/gattii NOT DETECTED NOT DETECTED Final    Comment: Performed at [facility], [address] [place], [place] [address]  C Difficile Quick Screen w PCR reflex     Status: None   Collection Time: 12/30/20  6:19 AM   Specimen: Stool  Result Value Ref Range Status   C Diff antigen NEGATIVE NEGATIVE Final   C Diff toxin NEGATIVE NEGATIVE Final   C Diff interpretation No C. difficile detected.  Final    Comment: Performed at [facility], [address] [place], [place] [address]  Gastrointestinal Panel by PCR , Stool     Status: Abnormal   Collection Time: 12/30/20  6:19 AM   Specimen: Stool  Result Value Ref Range Status  Campylobacter species NOT DETECTED NOT DETECTED Final   Plesimonas shigelloides NOT DETECTED NOT DETECTED Final   Salmonella species DETECTED (A) NOT DETECTED Final    Comment: RESULT CALLED TO, READ BACK BY AND VERIFIED WITH: C.GUALDONI,RN 0945 12/30/20 GM    Yersinia enterocolitica NOT DETECTED NOT DETECTED Final   Vibrio species NOT DETECTED NOT DETECTED Final   Vibrio cholerae NOT DETECTED NOT DETECTED Final   Enteroaggregative E coli (EAEC) NOT DETECTED NOT DETECTED Final   Enteropathogenic E coli (EPEC) NOT DETECTED NOT DETECTED Final   Enterotoxigenic E coli (ETEC) NOT DETECTED NOT DETECTED Final   Shiga like toxin producing E coli (STEC) NOT DETECTED NOT DETECTED Final   Shigella/Enteroinvasive E coli (EIEC) NOT DETECTED NOT DETECTED Final   Cryptosporidium NOT DETECTED NOT DETECTED Final   Cyclospora cayetanensis NOT DETECTED NOT DETECTED Final   Entamoeba histolytica NOT DETECTED NOT DETECTED Final   Giardia lamblia NOT DETECTED NOT DETECTED Final   Adenovirus F40/41 NOT DETECTED NOT DETECTED Final   Astrovirus NOT DETECTED NOT DETECTED Final   Norovirus GI/GII NOT DETECTED NOT DETECTED Final   Rotavirus A NOT DETECTED NOT DETECTED Final   Sapovirus (I, II,  IV, and V) NOT DETECTED NOT DETECTED Final    Comment: Performed at [facility], [address] [place], [place] [address]  Urine Culture     Status: Abnormal   Collection Time: 12/30/20  6:23 AM   Specimen: Urine, Random  Result Value Ref Range Status   Specimen Description   Final    URINE, RANDOM Performed at [facility], [address], [place], [place] [address]    Special Requests   Final    NONE Performed at [facility], [address], [place], [place] [address]    Culture (A)  Final    <10,000 COLONIES/mL INSIGNIFICANT GROWTH Performed at [facility], [address] [place], [place] [address]    Report Status 12/31/2020 FINAL  Final  CULTURE, BLOOD (ROUTINE X 2) w Reflex to ID Panel     Status: None (Preliminary result)   Collection Time: 12/30/20  5:11 PM   Specimen: BLOOD  Result Value Ref Range Status   Specimen Description BLOOD RIGHT ANTECUBITAL  Final   Special Requests   Final    BOTTLES DRAWN AEROBIC AND ANAEROBIC Blood Culture adequate volume   Culture   Final    NO GROWTH 2 DAYS Performed at [facility], [address], [place], [place] [address]    Report Status PENDING  Incomplete  CULTURE, BLOOD (ROUTINE X 2) w Reflex to ID Panel     Status: None (Preliminary result)   Collection Time: 12/30/20  7:06 PM   Specimen: BLOOD  Result Value Ref Range Status   Specimen Description BLOOD BLOOD LEFT HAND  Final   Special Requests   Final    BOTTLES DRAWN AEROBIC AND ANAEROBIC Blood Culture results may not be optimal due to an inadequate volume of blood received in culture bottles   Culture   Final    NO GROWTH 2 DAYS Performed at [facility], [address] [place], [place] [address]    Report Status PENDING  Incomplete    Procedures and diagnostic studies:  ECHOCARDIOGRAM COMPLETE  Result Date: 12/31/2020    ECHOCARDIOGRAM REPORT   Patient Name:   Josie Dixon Date of Exam: 12/31/2020  Medical Rec #:  [id number]         Height:  68.0 in Accession #:    [id number]        Weight:       290.1 lb Date of Birth:  [birth year]         BSA:          2.394 m Patient Age:    45 years          BP:           110/76 mmHg Patient Gender: F                 HR:           90 bpm. Exam Location:  [facility] Procedure: 2D Echo, Color Doppler, Cardiac Doppler and Intracardiac            Opacification Agent Indications:     R78.81 Bacteremia  History:         Patient has no prior history of Echocardiogram examinations.                  Risk Factors:HCL.  Sonographer:     Humphrey RollsJoan Heiss RDCS (AE) Referring Phys:  [id number] Lurene ShadowBERNARD Ermine Stebbins Diagnosing Phys: Yvonne Kendallhristopher End MD  Sonographer Comments: Technically difficult study due to poor echo windows. Image acquisition challenging due to patient body habitus. IMPRESSIONS  1. Left ventricular ejection fraction, by estimation, is 60 to 65%. The left ventricle has normal function. Left ventricular endocardial border not optimally defined to evaluate regional wall motion. Left ventricular diastolic parameters are consistent with Grade II diastolic dysfunction (pseudonormalization).  2. Right ventricular systolic function is normal. The right ventricular size is normal. Tricuspid regurgitation signal is inadequate for assessing PA pressure.  3. The mitral valve was not well visualized. No evidence of mitral valve regurgitation. No evidence of mitral stenosis.  4. The aortic valve was not well visualized. Aortic valve regurgitation is not visualized. No aortic stenosis is present. Conclusion(s)/Recommendation(s): Valves not well-visualized to exclude endocarditis. Consider further evaluation with transesophageal echocardiogram as clinically indicated. FINDINGS  Left Ventricle: Left ventricular ejection fraction, by estimation, is 60 to 65%. The left ventricle has normal function. Left ventricular endocardial border not optimally defined to evaluate regional wall motion. Definity contrast  agent was given IV to delineate the left ventricular endocardial borders. The left ventricular internal cavity size was normal in size. There is borderline left ventricular hypertrophy. Left ventricular diastolic parameters are consistent with Grade II diastolic dysfunction (pseudonormalization). Right Ventricle: The right ventricular size is normal. No increase in right ventricular wall thickness. Right ventricular systolic function is normal. Tricuspid regurgitation signal is inadequate for assessing PA pressure. Left Atrium: Left atrial size was normal in size. Right Atrium: Right atrial size was normal in size. Pericardium: The pericardium was not well visualized. Mitral Valve: The mitral valve was not well visualized. No evidence of mitral valve regurgitation. No evidence of mitral valve stenosis. MV peak gradient, 2.5 mmHg. The mean mitral valve gradient is 1.0 mmHg. Tricuspid Valve: The tricuspid valve is not well visualized. Tricuspid valve regurgitation is trivial. Aortic Valve: The aortic valve was not well visualized. Aortic valve regurgitation is not visualized. No aortic stenosis is present. Aortic valve mean gradient measures 6.0 mmHg. Aortic valve peak gradient measures 10.4 mmHg. Aortic valve area, by VTI measures 1.83 cm. Pulmonic Valve: The pulmonic valve was not well visualized. Pulmonic valve regurgitation is not visualized. No evidence of pulmonic stenosis. Aorta: The aortic root is normal in size and structure. IAS/Shunts: The interatrial septum was not well visualized.  LEFT VENTRICLE PLAX 2D LVIDd:         4.30 cm     Diastology LVIDs:         3.00 cm     LV e' medial:    6.64 cm/s LV PW:         1.02 cm     LV E/e' medial:  10.1 LV IVS:        1.00 cm     LV e' lateral:   7.72 cm/s LVOT diam:     1.90 cm     LV E/e' lateral: 8.7 LV SV:         50 LV SV Index:   21 LVOT Area:     2.84 cm  LV Volumes (MOD) LV vol d, MOD A4C: 86.7 ml LV vol s, MOD A4C: 37.7 ml LV SV MOD A4C:     86.7 ml RIGHT  VENTRICLE RV Basal diam:  3.00 cm LEFT ATRIUM           Index       RIGHT ATRIUM          Index LA Vol (A4C): 40.8 ml 17.04 ml/m RA Area:     8.89 cm                                   RA Volume:   15.70 ml 6.56 ml/m  AORTIC VALVE                    PULMONIC VALVE AV Area (Vmax):    1.85 cm     PV Vmax:       0.90 m/s AV Area (Vmean):   1.77 cm     PV Vmean:      55.200 cm/s AV Area (VTI):     1.83 cm     PV VTI:        0.115 m AV Vmax:           161.00 cm/s  PV Peak grad:  3.2 mmHg AV Vmean:          113.000 cm/s PV Mean grad:  1.0 mmHg AV VTI:            0.274 m AV Peak Grad:      10.4 mmHg AV Mean Grad:      6.0 mmHg LVOT Vmax:         105.00 cm/s LVOT Vmean:        70.600 cm/s LVOT VTI:          0.177 m LVOT/AV VTI ratio: 0.65  AORTA Ao Root diam: 2.40 cm MITRAL VALVE MV Area (PHT): 4.33 cm    SHUNTS MV Area VTI:   3.49 cm    Systemic VTI:  0.18 m MV Peak grad:  2.5 mmHg    Systemic Diam: 1.90 cm MV Mean grad:  1.0 mmHg MV Vmax:       0.79 m/s MV Vmean:      53.3 cm/s MV Decel Time: 175 msec MV E velocity: 67.30 cm/s MV A velocity: 56.10 cm/s MV E/A ratio:  1.20 Cristal Deer End MD Electronically signed by Yvonne Kendall MD Signature Date/Time: 12/31/2020/5:30:50 PM    Final                LOS: 3 days   Keisa Blow  [facility]   Pager on [email or web address]. If 7PM-7AM, please contact night-coverage  at [email or web address]     01/01/2021, 12:22 PM

## 2021-01-02 LAB — BASIC METABOLIC PANEL
Anion gap: 8 (ref 5–15)
BUN: <5 mg/dL — ABNORMAL LOW (ref 6–20)
CO2: 27 mmol/L (ref 22–32)
Calcium: 8 mg/dL — ABNORMAL LOW (ref 8.9–10.3)
Chloride: 102 mmol/L (ref 98–111)
Creatinine, Ser: 0.78 mg/dL (ref 0.44–1.00)
GFR, Estimated: >60 mL/min (ref >60)
Glucose, Bld: 148 mg/dL — ABNORMAL HIGH (ref 70–99)
Potassium: 3.2 mmol/L — ABNORMAL LOW (ref 3.5–5.1)
Sodium: 137 mmol/L (ref 135–145)

## 2021-01-02 LAB — CULTURE, BLOOD (ROUTINE X 2)
Special Requests: ADEQUATE
Special Requests: ADEQUATE

## 2021-01-02 LAB — GLUCOSE, CAPILLARY
Glucose-Capillary: 149 mg/dL — ABNORMAL HIGH (ref 70–99)
Glucose-Capillary: 146 mg/dL — ABNORMAL HIGH (ref 70–99)
Glucose-Capillary: 124 mg/dL — ABNORMAL HIGH (ref 70–99)
Glucose-Capillary: 188 mg/dL — ABNORMAL HIGH (ref 70–99)

## 2021-01-02 LAB — MAGNESIUM: Magnesium: 2.1 mg/dL (ref 1.7–2.4)

## 2021-01-02 LAB — PHOSPHORUS: Phosphorus: 3.4 mg/dL (ref 2.5–4.6)

## 2021-01-02 MED ORDER — HYDROCODONE-ACETAMINOPHEN 5-325 MG PO TABS
1.0000 | ORAL_TABLET | Freq: Three times a day (TID) | ORAL | Status: DC | PRN
  Administered 2021-01-02 – 2021-01-03 (×5): 1 via ORAL
  Filled 2021-01-02 (×6): qty 1

## 2021-01-02 MED ORDER — DIPHENOXYLATE-ATROPINE 2.5-0.025 MG PO TABS
2.0000 | ORAL_TABLET | Freq: Four times a day (QID) | ORAL | Status: DC | PRN
  Administered 2021-01-02 – 2021-01-03 (×4): 2 via ORAL
  Filled 2021-01-02 (×6): qty 2

## 2021-01-02 MED ORDER — POTASSIUM CHLORIDE IN NACL 40-0.9 MEQ/L-% IV SOLN
INTRAVENOUS | Status: AC
  Filled 2021-01-02 (×3): qty 1000

## 2021-01-02 MED ORDER — CYCLOBENZAPRINE HCL 10 MG PO TABS: 10.0000 mg | ORAL_TABLET | Freq: Every evening | ORAL | Status: DC | PRN

## 2021-01-02 MED ORDER — MORPHINE SULFATE (PF) 2 MG/ML IV SOLN
2.0000 mg | Freq: Four times a day (QID) | INTRAVENOUS | Status: DC | PRN
  Administered 2021-01-02 – 2021-01-04 (×7): 2 mg via INTRAVENOUS
  Filled 2021-01-02 (×9): qty 1

## 2021-01-02 NOTE — Progress Notes (Signed)
Progress Note    Priscilla Moore  QJJ:[id number] DOB: [birth year]  DOA: 12/29/2020 PCP: [facility]      Brief Narrative:    Medical records reviewed and are as summarized below:  Priscilla Moore is a 45 y.o. female with medical history significant for type 2 diabetes mellitus, hyperlipidemia, hypertension, fibromyalgia, bipolar disorder, tachycardia, who presented to the hospital with a 2-day history of vomiting, watery nonbloody diarrhea, nausea and abdominal pain.  She was admitted to the hospital for severe sepsis secondary to acute colitis complicated by AKI.  GI panel showed Salmonella.  Stool for C. difficile toxin was negative.  Blood culture showed staph epidermis and staph hominis which were thought to be contaminants.  She was treated with analgesics, IV fluids and empiric IV antibiotics.    Assessment/Plan:   Active Problems:   Bipolar 1 disorder (HCC)   Type 2 diabetes mellitus with hyperglycemia, without long-term current use of insulin (HCC)   Fibromyalgia   Colitis   Severe sepsis (HCC)   Abdominal pain, vomiting, and diarrhea   AKI (acute kidney injury) (HCC)   Dyslipidemia   COPD (chronic obstructive pulmonary disease) (HCC)   Body mass index is 44.11 kg/m.  (Morbid obesity)  Severe sepsis secondary to acute Salmonella gastroenteritis/colitis, severe diarrhea: Continue IV ciprofloxacin.  Restart IV fluids because of vomiting and diarrhea.  Substitute Lomotil for Imodium.  No evidence of vegetations on 2D echo but there is evidence of grade 2 diastolic dysfunction.  Initial blood culture showed staph epidermidis and staph hominis which are likely contaminants.  Discontinue IV vancomycin.  No growth on repeat blood cultures thus far.  AKI: Improved.    Hypomagnesemia, hypokalemia and hypophosphatemia: Replete potassium and monitor levels.  Abnormal urinalysis but no growth on urine culture.  No UTI.  Type II DM with hyperglycemia:  Continue Lantus and NovoLog.  COPD: Stable.  Continue bronchodilators.  Elevated liver enzymes: This is likely from hepatic steatosis.  Other comorbidities include hepatic steatosis, fibromyalgia, history of tachycardia  Possible discharge home tomorrow if vomiting and diarrhea improve.  Diet Order             Diet regular Room service appropriate? Yes; Fluid consistency: Thin  Diet effective now                      Consultants: None  Procedures: None    Medications:    DULoxetine  20 mg Oral BID   enoxaparin (LOVENOX) injection  0.5 mg/kg Subcutaneous Q24H   gabapentin  900 mg Oral TID   insulin aspart  0-15 Units Subcutaneous TID WC   insulin glargine  10 Units Subcutaneous Daily   mometasone-formoterol  2 puff Inhalation BID   pantoprazole  40 mg Oral Daily   tiotropium  18 mcg Inhalation Daily   traZODone  50 mg Oral QHS   Continuous Infusions:  0.9 % NaCl with KCl 40 mEq / L 100 mL/hr at 01/02/21 1124   ciprofloxacin 400 mg (01/02/21 1302)     Anti-infectives (From admission, onward)    Start     Dose/Rate Route Frequency Ordered Stop   01/01/21 1600  vancomycin (VANCOREADY) IVPB 1000 mg/200 mL  Status:  Discontinued        1,000 mg 200 mL/hr over 60 Minutes Intravenous Every 12 hours 01/01/21 1510 01/01/21 1510   12/31/20 2200  vancomycin (VANCOREADY) IVPB 1750 mg/350 mL  Status:  Discontinued  1,750 mg 175 mL/hr over 120 Minutes Intravenous Every 24 hours 12/30/20 1954 12/31/20 0744   12/31/20 1800  vancomycin (VANCOREADY) IVPB 1750 mg/350 mL  Status:  Discontinued        1,750 mg 175 mL/hr over 120 Minutes Intravenous Every 24 hours 12/30/20 1626 12/30/20 1954   12/31/20 1200  vancomycin (VANCOREADY) IVPB 1000 mg/200 mL  Status:  Discontinued        1,000 mg 200 mL/hr over 60 Minutes Intravenous Every 12 hours 12/31/20 0744 12/31/20 0814   12/31/20 1200  vancomycin (VANCOCIN) IVPB 1000 mg/200 mL premix  Status:  Discontinued         1,000 mg 200 mL/hr over 60 Minutes Intravenous Every 12 hours 12/31/20 0814 01/02/21 0942   12/30/20 2200  vancomycin (VANCOCIN) IVPB 1000 mg/200 mL premix        1,000 mg 200 mL/hr over 60 Minutes Intravenous  Once 12/30/20 1953 12/31/20 0022   12/30/20 2000  vancomycin (VANCOREADY) IVPB 1500 mg/300 mL        1,500 mg 150 mL/hr over 120 Minutes Intravenous  Once 12/30/20 1953 12/30/20 2216   12/30/20 1830  vancomycin (VANCOCIN) IVPB 1000 mg/200 mL premix  Status:  Discontinued        1,000 mg 200 mL/hr over 60 Minutes Intravenous  Once 12/30/20 1633 12/30/20 1953   12/30/20 1715  vancomycin (VANCOREADY) IVPB 1000 mg/200 mL  Status:  Discontinued        1,000 mg 200 mL/hr over 60 Minutes Intravenous  Once 12/30/20 1622 12/30/20 1632   12/30/20 1715  vancomycin (VANCOREADY) IVPB 1500 mg/300 mL  Status:  Discontinued        1,500 mg 150 mL/hr over 120 Minutes Intravenous  Once 12/30/20 1624 12/30/20 1953   12/30/20 1630  vancomycin (VANCOCIN) 2,500 mg in sodium chloride 0.9 % 500 mL IVPB  Status:  Discontinued        2,500 mg 250 mL/hr over 120 Minutes Intravenous  Once 12/30/20 1620 12/30/20 1622   12/30/20 0110  ciprofloxacin (CIPRO) IVPB 400 mg        400 mg 200 mL/hr over 60 Minutes Intravenous Every 12 hours 12/30/20 0110     12/30/20 0110  metroNIDAZOLE (FLAGYL) IVPB 500 mg  Status:  Discontinued        500 mg 100 mL/hr over 60 Minutes Intravenous Every 8 hours 12/30/20 0110 12/30/20 1631   12/29/20 2100  ciprofloxacin (CIPRO) IVPB 400 mg        400 mg 200 mL/hr over 60 Minutes Intravenous  Once 12/29/20 2047 12/29/20 2339   12/29/20 2100  metroNIDAZOLE (FLAGYL) IVPB 500 mg        500 mg 100 mL/hr over 60 Minutes Intravenous  Once 12/29/20 2047 12/29/20 2339              Family Communication/Anticipated D/C date and plan/Code Status   DVT prophylaxis:      Code Status: Full Code  Family Communication: None Disposition Plan:    Status is: Inpatient  Remains  inpatient appropriate because:IV treatments appropriate due to intensity of illness or inability to take PO  Dispo: The patient is from: Home              Anticipated d/c is to: Home              Patient currently is not medically stable to d/c.   Difficult to place patient No  Subjective:   Interval events noted.  She said she vomited about 4 times last night.  She also said she had about 9-10 watery stools overnight.  She still has abdominal pain.  She does not feel well today.  Her nurse was at the bedside.   Objective:    Vitals:   01/01/21 1626 01/01/21 1627 01/02/21 0434 01/02/21 0821  BP: (!) 137/106 (!) 134/92 122/78 119/75  Pulse: 85 80 91 84  Resp: 16  15 15   Temp: 97.8 F (36.6 C)  98.3 F (36.8 C) 97.9 F (36.6 C)  TempSrc: Oral  Oral Oral  SpO2: 100%  96% 98%  Weight:      Height:       No data found.  No intake or output data in the 24 hours ending 01/02/21 1427  Filed Weights   12/29/20 1730 12/29/20 1808  Weight: 121.6 kg 131.6 kg    Exam:  GEN: NAD SKIN: Warm and dry EYES: EOMI ENT: MMM CV: RRR PULM: CTA B ABD: soft, obese, mild to moderate mid abdominal tenderness, no rebound tenderness or guarding,, +BS CNS: AAO x 3, non focal EXT: No edema or tenderness             Data Reviewed:   I have personally reviewed following labs and imaging studies:  Labs: Labs show the following:   Basic Metabolic Panel: Recent Labs  Lab 12/29/20 1859 12/30/20 1441 12/31/20 0538 01/01/21 0647 01/02/21 0820  NA 130* 131* 132* 135 137  K 3.6 3.5 3.5 3.3* 3.2*  CL 102 104 103 104 102  CO2 19* 20* 23 24 27   GLUCOSE 286* 189* 174* 134* 148*  BUN 19 15 12  5* <5*  CREATININE 1.65* 1.15* 0.99 0.87 0.78  CALCIUM 8.5* 7.7* 7.8* 7.5* 8.0*  MG  --   --  1.6* 2.1 2.1  PHOS  --   --  1.9* 2.1* 3.4   GFR Estimated Creatinine Clearance: 124.9 mL/min (by C-G formula based on SCr of 0.78 mg/dL). Liver Function Tests: Recent Labs   Lab 12/29/20 1859 12/31/20 0538  AST 52* 62*  ALT 48* 50*  ALKPHOS 71 53  BILITOT 0.9 0.8  PROT 7.7 6.9  ALBUMIN 3.3* 2.9*   Recent Labs  Lab 12/29/20 1859  LIPASE 20   No results for input(s): AMMONIA in the last 168 hours. Coagulation profile Recent Labs  Lab 12/30/20 1441  INR 1.3*    CBC: Recent Labs  Lab 12/29/20 1733 12/30/20 1441  WBC 9.5 6.5  HGB 14.6 12.5  HCT 44.4 38.7  MCV 76.7* 76.5*  PLT 277 208   Cardiac Enzymes: No results for input(s): CKTOTAL, CKMB, CKMBINDEX, TROPONINI in the last 168 hours. BNP (last 3 results) No results for input(s): PROBNP in the last 8760 hours. CBG: Recent Labs  Lab 01/01/21 1141 01/01/21 1628 01/01/21 2122 01/02/21 0823 01/02/21 1155  GLUCAP 151* 174* 178* 149* 146*   D-Dimer: No results for input(s): DDIMER in the last 72 hours. Hgb A1c: Recent Labs    12/30/20 1441  HGBA1C 10.7*   Lipid Profile: No results for input(s): CHOL, HDL, LDLCALC, TRIG, CHOLHDL, LDLDIRECT in the last 72 hours. Thyroid function studies: No results for input(s): TSH, T4TOTAL, T3FREE, THYROIDAB in the last 72 hours.  Invalid input(s): FREET3 Anemia work up: No results for input(s): VITAMINB12, FOLATE, FERRITIN, TIBC, IRON, RETICCTPCT in the last 72 hours. Sepsis Labs: Recent Labs  Lab 12/29/20 1733 12/29/20 1834 12/29/20 2143 12/30/20 1441  WBC  9.5  --   --  6.5  LATICACIDVEN  --  3.8* 2.1*  --     Microbiology Recent Results (from the past 240 hour(s))  Resp Panel by RT-PCR (Flu A&B, Covid) Nasopharyngeal Swab     Status: None   Collection Time: 12/29/20  6:06 PM   Specimen: Nasopharyngeal Swab; Nasopharyngeal(NP) swabs in vial transport medium  Result Value Ref Range Status   SARS Coronavirus 2 by RT PCR NEGATIVE NEGATIVE Final    Comment: (NOTE) SARS-CoV-2 target nucleic acids are NOT DETECTED.  The SARS-CoV-2 RNA is generally detectable in upper respiratory specimens during the acute phase of infection. The  lowest concentration of SARS-CoV-2 viral copies this assay can detect is 138 copies/mL. A negative result does not preclude SARS-Cov-2 infection and should not be used as the sole basis for treatment or other patient management decisions. A negative result may occur with  improper specimen collection/handling, submission of specimen other than nasopharyngeal swab, presence of viral mutation(s) within the areas targeted by this assay, and inadequate number of viral copies(<138 copies/mL). A negative result must be combined with clinical observations, patient history, and epidemiological information. The expected result is Negative.  Fact Sheet for Patients:  BloggerCourse.com  Fact Sheet for Healthcare Providers:  SeriousBroker.it  This test is no t yet approved or cleared by the [place] FDA and  has been authorized for detection and/or diagnosis of SARS-CoV-2 by FDA under an Emergency Use Authorization (EUA). This EUA will remain  in effect (meaning this test can be used) for the duration of the COVID-19 declaration under Section 564(b)(1) of the Act, 21 U.S.C.section 360bbb-3(b)(1), unless the authorization is terminated  or revoked sooner.       Influenza A by PCR NEGATIVE NEGATIVE Final   Influenza B by PCR NEGATIVE NEGATIVE Final    Comment: (NOTE) The Xpert Xpress SARS-CoV-2/FLU/RSV plus assay is intended as an aid in the diagnosis of influenza from Nasopharyngeal swab specimens and should not be used as a sole basis for treatment. Nasal washings and aspirates are unacceptable for Xpert Xpress SARS-CoV-2/FLU/RSV testing.  Fact Sheet for Patients: BloggerCourse.com  Fact Sheet for Healthcare Providers: SeriousBroker.it  This test is not yet approved or cleared by the [place] FDA and has been authorized for detection and/or diagnosis of SARS-CoV-2 by FDA under  an Emergency Use Authorization (EUA). This EUA will remain in effect (meaning this test can be used) for the duration of the COVID-19 declaration under Section 564(b)(1) of the Act, 21 U.S.C. section 360bbb-3(b)(1), unless the authorization is terminated or revoked.  Performed at [facility], [address] [place], [place] [address]   Culture, blood (routine x 2)     Status: Abnormal   Collection Time: 12/29/20  9:44 PM   Specimen: BLOOD  Result Value Ref Range Status   Specimen Description   Final    BLOOD LEFT ASSIST CONTROL Performed at [facility], [address], [place], [place] [address]    Special Requests   Final    BOTTLES DRAWN AEROBIC AND ANAEROBIC Blood Culture adequate volume Performed at [facility], [address] [place], [place] [address]    Culture  Setup Time   Final    IN BOTH AEROBIC AND ANAEROBIC BOTTLES GRAM POSITIVE COCCI CRITICAL RESULT CALLED TO, READ BACK BY AND VERIFIED WITH: Nash Mantis 12/30/20 1522 KLW Performed at [facility], [address] [place], [place] [address]    Culture STAPHYLOCOCCUS HOMINIS (A)  Final   Report Status 01/02/2021 FINAL  Final   Organism ID, Bacteria STAPHYLOCOCCUS HOMINIS  Final      Susceptibility   Staphylococcus hominis - MIC*    CIPROFLOXACIN <=0.5 SENSITIVE Sensitive     ERYTHROMYCIN >=8 RESISTANT Resistant     GENTAMICIN <=0.5 SENSITIVE Sensitive     OXACILLIN <=0.25 SENSITIVE Sensitive     TETRACYCLINE 2 SENSITIVE Sensitive     VANCOMYCIN <=0.5 SENSITIVE Sensitive     TRIMETH/SULFA <=10 SENSITIVE Sensitive     CLINDAMYCIN 4 RESISTANT Resistant     RIFAMPIN <=0.5 SENSITIVE Sensitive     Inducible Clindamycin NEGATIVE Sensitive     * STAPHYLOCOCCUS HOMINIS  Culture, blood (routine x 2)     Status: Abnormal   Collection Time: 12/29/20  9:44 PM   Specimen: BLOOD  Result Value Ref Range Status   Specimen Description   Final    BLOOD RIGHT ASSIST CONTROL Performed  at [facility], [address] [place], [place] [address]    Special Requests   Final    BOTTLES DRAWN AEROBIC AND ANAEROBIC Blood Culture adequate volume Performed at [facility], [address] [place], [place] [address]    Culture  Setup Time   Final    GRAM POSITIVE COCCI IN CLUSTERS AEROBIC BOTTLE ONLY CRITICAL RESULT CALLED TO, READ BACK BY AND VERIFIED WITH: Nash Mantis 12/30/20 1545 JGF Performed at [facility], [address] [place], [place] [address]    Culture (A)  Final    STAPHYLOCOCCUS EPIDERMIDIS THE SIGNIFICANCE OF ISOLATING THIS ORGANISM FROM A SINGLE SET OF BLOOD CULTURES WHEN MULTIPLE SETS ARE DRAWN IS UNCERTAIN. PLEASE NOTIFY THE MICROBIOLOGY DEPARTMENT WITHIN ONE WEEK IF SPECIATION AND SENSITIVITIES ARE REQUIRED. STAPHYLOCOCCUS HOMINIS SUSCEPTIBILITIES PERFORMED ON PREVIOUS CULTURE WITHIN THE LAST 5 DAYS. Performed at [facility], [address] [place], [place] [address]    Report Status 01/02/2021 FINAL  Final  Blood Culture ID Panel (Reflexed)     Status: Abnormal   Collection Time: 12/29/20  9:44 PM  Result Value Ref Range Status   Enterococcus faecalis NOT DETECTED NOT DETECTED Final   Enterococcus Faecium NOT DETECTED NOT DETECTED Final   Listeria monocytogenes NOT DETECTED NOT DETECTED Final   Staphylococcus species DETECTED (A) NOT DETECTED Final    Comment: CRITICAL RESULT CALLED TO, READ BACK BY AND VERIFIED WITH: Lelon Mast RAUAR 12/30/20 1522 KLW    Staphylococcus aureus (BCID) NOT DETECTED NOT DETECTED Final   Staphylococcus epidermidis NOT DETECTED NOT DETECTED Final   Staphylococcus lugdunensis NOT DETECTED NOT DETECTED Final   Streptococcus species NOT DETECTED NOT DETECTED Final   Streptococcus agalactiae NOT DETECTED NOT DETECTED Final   Streptococcus pneumoniae NOT DETECTED NOT DETECTED Final   Streptococcus pyogenes NOT DETECTED NOT DETECTED Final   A.calcoaceticus-baumannii NOT DETECTED NOT  DETECTED Final   Bacteroides fragilis NOT DETECTED NOT DETECTED Final   Enterobacterales NOT DETECTED NOT DETECTED Final   Enterobacter cloacae complex NOT DETECTED NOT DETECTED Final   Escherichia coli NOT DETECTED NOT DETECTED Final   Klebsiella aerogenes NOT DETECTED NOT DETECTED Final   Klebsiella oxytoca NOT DETECTED NOT DETECTED Final   Klebsiella pneumoniae NOT DETECTED NOT DETECTED Final   Proteus species NOT DETECTED NOT DETECTED Final   Salmonella species NOT DETECTED NOT DETECTED Final   Serratia marcescens NOT DETECTED NOT DETECTED Final   Haemophilus influenzae NOT DETECTED NOT DETECTED Final   Neisseria meningitidis NOT DETECTED NOT DETECTED Final   Pseudomonas aeruginosa  NOT DETECTED NOT DETECTED Final   Stenotrophomonas maltophilia NOT DETECTED NOT DETECTED Final   Candida albicans NOT DETECTED NOT DETECTED Final   Candida auris NOT DETECTED NOT DETECTED Final   Candida glabrata NOT DETECTED NOT DETECTED Final   Candida krusei NOT DETECTED NOT DETECTED Final   Candida parapsilosis NOT DETECTED NOT DETECTED Final   Candida tropicalis NOT DETECTED NOT DETECTED Final   Cryptococcus neoformans/gattii NOT DETECTED NOT DETECTED Final    Comment: Performed at [facility], [address] [place], [place] [address]  C Difficile Quick Screen w PCR reflex     Status: None   Collection Time: 12/30/20  6:19 AM   Specimen: Stool  Result Value Ref Range Status   C Diff antigen NEGATIVE NEGATIVE Final   C Diff toxin NEGATIVE NEGATIVE Final   C Diff interpretation No C. difficile detected.  Final    Comment: Performed at [facility], [address] [place], [place] [address]  Gastrointestinal Panel by PCR , Stool     Status: Abnormal   Collection Time: 12/30/20  6:19 AM   Specimen: Stool  Result Value Ref Range Status   Campylobacter species NOT DETECTED NOT DETECTED Final   Plesimonas shigelloides NOT DETECTED NOT DETECTED Final   Salmonella species  DETECTED (A) NOT DETECTED Final    Comment: RESULT CALLED TO, READ BACK BY AND VERIFIED WITH: C.GUALDONI,RN 0945 12/30/20 GM    Yersinia enterocolitica NOT DETECTED NOT DETECTED Final   Vibrio species NOT DETECTED NOT DETECTED Final   Vibrio cholerae NOT DETECTED NOT DETECTED Final   Enteroaggregative E coli (EAEC) NOT DETECTED NOT DETECTED Final   Enteropathogenic E coli (EPEC) NOT DETECTED NOT DETECTED Final   Enterotoxigenic E coli (ETEC) NOT DETECTED NOT DETECTED Final   Shiga like toxin producing E coli (STEC) NOT DETECTED NOT DETECTED Final   Shigella/Enteroinvasive E coli (EIEC) NOT DETECTED NOT DETECTED Final   Cryptosporidium NOT DETECTED NOT DETECTED Final   Cyclospora cayetanensis NOT DETECTED NOT DETECTED Final   Entamoeba histolytica NOT DETECTED NOT DETECTED Final   Giardia lamblia NOT DETECTED NOT DETECTED Final   Adenovirus F40/41 NOT DETECTED NOT DETECTED Final   Astrovirus NOT DETECTED NOT DETECTED Final   Norovirus GI/GII NOT DETECTED NOT DETECTED Final   Rotavirus A NOT DETECTED NOT DETECTED Final   Sapovirus (I, II, IV, and V) NOT DETECTED NOT DETECTED Final    Comment: Performed at [facility], [address], [place], [place] [address]  Urine Culture     Status: Abnormal   Collection Time: 12/30/20  6:23 AM   Specimen: Urine, Random  Result Value Ref Range Status   Specimen Description   Final    URINE, RANDOM Performed at [facility], [address], [place], [place] [address]    Special Requests   Final    NONE Performed at [facility], [address], [place], [place] [address]    Culture (A)  Final    <10,000 COLONIES/mL INSIGNIFICANT GROWTH Performed at [facility], [address], [place], [place] [address]    Report Status 12/31/2020 FINAL  Final  CULTURE, BLOOD (ROUTINE X 2) w Reflex to ID Panel     Status: None (Preliminary result)   Collection Time: 12/30/20  5:11 PM   Specimen: BLOOD  Result Value Ref  Range Status   Specimen Description BLOOD RIGHT ANTECUBITAL  Final   Special Requests   Final    BOTTLES DRAWN  AEROBIC AND ANAEROBIC Blood Culture adequate volume   Culture   Final    NO GROWTH 3 DAYS Performed at [facility], [address] [place], [place] [address]    Report Status PENDING  Incomplete  CULTURE, BLOOD (ROUTINE X 2) w Reflex to ID Panel     Status: None (Preliminary result)   Collection Time: 12/30/20  7:06 PM   Specimen: BLOOD  Result Value Ref Range Status   Specimen Description BLOOD BLOOD LEFT HAND  Final   Special Requests   Final    BOTTLES DRAWN AEROBIC AND ANAEROBIC Blood Culture results may not be optimal due to an inadequate volume of blood received in culture bottles   Culture   Final    NO GROWTH 3 DAYS Performed at [facility], [address] [place], [place] [address]    Report Status PENDING  Incomplete    Procedures and diagnostic studies:  ECHOCARDIOGRAM COMPLETE  Result Date: 12/31/2020    ECHOCARDIOGRAM REPORT   Patient Name:   Priscilla Moore Date of Exam: 12/31/2020 Medical Rec #:  [id number]         Height:       68.0 in Accession #:    [id number]        Weight:       290.1 lb Date of Birth:  08/10/1975         BSA:          2.394 m Patient Age:    45 years          BP:           110/76 mmHg Patient Gender: F                 HR:           90 bpm. Exam Location:  [facility] Procedure: 2D Echo, Color Doppler, Cardiac Doppler and Intracardiac            Opacification Agent Indications:     R78.81 Bacteremia  History:         Patient has no prior history of Echocardiogram examinations.                  Risk Factors:HCL.  Sonographer:     Humphrey Rolls RDCS (AE) Referring Phys:  [id number] Lurene Shadow Diagnosing Phys: Yvonne Kendall MD  Sonographer Comments: Technically difficult study due to poor echo windows. Image acquisition challenging due to patient body habitus. IMPRESSIONS  1. Left ventricular ejection fraction, by estimation, is 60  to 65%. The left ventricle has normal function. Left ventricular endocardial border not optimally defined to evaluate regional wall motion. Left ventricular diastolic parameters are consistent with Grade II diastolic dysfunction (pseudonormalization).  2. Right ventricular systolic function is normal. The right ventricular size is normal. Tricuspid regurgitation signal is inadequate for assessing PA pressure.  3. The mitral valve was not well visualized. No evidence of mitral valve regurgitation. No evidence of mitral stenosis.  4. The aortic valve was not well visualized. Aortic valve regurgitation is not visualized. No aortic stenosis is present. Conclusion(s)/Recommendation(s): Valves not well-visualized to exclude endocarditis. Consider further evaluation with transesophageal echocardiogram as clinically indicated. FINDINGS  Left Ventricle: Left ventricular ejection fraction, by estimation, is 60 to 65%. The left ventricle has normal function. Left ventricular endocardial border not optimally defined to evaluate regional wall motion. Definity contrast agent was given IV to delineate the left ventricular endocardial borders. The left ventricular internal cavity size was normal  in size. There is borderline left ventricular hypertrophy. Left ventricular diastolic parameters are consistent with Grade II diastolic dysfunction (pseudonormalization). Right Ventricle: The right ventricular size is normal. No increase in right ventricular wall thickness. Right ventricular systolic function is normal. Tricuspid regurgitation signal is inadequate for assessing PA pressure. Left Atrium: Left atrial size was normal in size. Right Atrium: Right atrial size was normal in size. Pericardium: The pericardium was not well visualized. Mitral Valve: The mitral valve was not well visualized. No evidence of mitral valve regurgitation. No evidence of mitral valve stenosis. MV peak gradient, 2.5 mmHg. The mean mitral valve gradient is  1.0 mmHg. Tricuspid Valve: The tricuspid valve is not well visualized. Tricuspid valve regurgitation is trivial. Aortic Valve: The aortic valve was not well visualized. Aortic valve regurgitation is not visualized. No aortic stenosis is present. Aortic valve mean gradient measures 6.0 mmHg. Aortic valve peak gradient measures 10.4 mmHg. Aortic valve area, by VTI measures 1.83 cm. Pulmonic Valve: The pulmonic valve was not well visualized. Pulmonic valve regurgitation is not visualized. No evidence of pulmonic stenosis. Aorta: The aortic root is normal in size and structure. IAS/Shunts: The interatrial septum was not well visualized.  LEFT VENTRICLE PLAX 2D LVIDd:         4.30 cm     Diastology LVIDs:         3.00 cm     LV e' medial:    6.64 cm/s LV PW:         1.02 cm     LV E/e' medial:  10.1 LV IVS:        1.00 cm     LV e' lateral:   7.72 cm/s LVOT diam:     1.90 cm     LV E/e' lateral: 8.7 LV SV:         50 LV SV Index:   21 LVOT Area:     2.84 cm  LV Volumes (MOD) LV vol d, MOD A4C: 86.7 ml LV vol s, MOD A4C: 37.7 ml LV SV MOD A4C:     86.7 ml RIGHT VENTRICLE RV Basal diam:  3.00 cm LEFT ATRIUM           Index       RIGHT ATRIUM          Index LA Vol (A4C): 40.8 ml 17.04 ml/m RA Area:     8.89 cm                                   RA Volume:   15.70 ml 6.56 ml/m  AORTIC VALVE                    PULMONIC VALVE AV Area (Vmax):    1.85 cm     PV Vmax:       0.90 m/s AV Area (Vmean):   1.77 cm     PV Vmean:      55.200 cm/s AV Area (VTI):     1.83 cm     PV VTI:        0.115 m AV Vmax:           161.00 cm/s  PV Peak grad:  3.2 mmHg AV Vmean:          113.000 cm/s PV Mean grad:  1.0 mmHg AV VTI:            0.274  m AV Peak Grad:      10.4 mmHg AV Mean Grad:      6.0 mmHg LVOT Vmax:         105.00 cm/s LVOT Vmean:        70.600 cm/s LVOT VTI:          0.177 m LVOT/AV VTI ratio: 0.65  AORTA Ao Root diam: 2.40 cm MITRAL VALVE MV Area (PHT): 4.33 cm    SHUNTS MV Area VTI:   3.49 cm    Systemic VTI:  0.18 m MV  Peak grad:  2.5 mmHg    Systemic Diam: 1.90 cm MV Mean grad:  1.0 mmHg MV Vmax:       0.79 m/s MV Vmean:      53.3 cm/s MV Decel Time: 175 msec MV E velocity: 67.30 cm/s MV A velocity: 56.10 cm/s MV E/A ratio:  1.20 Cristal Deer End MD Electronically signed by Yvonne Kendall MD Signature Date/Time: 12/31/2020/5:30:50 PM    Final                LOS: 4 days   Gabriel Paulding  [facility]   Pager on [email or web address]. If 7PM-7AM, please contact night-coverage at [email or web address]     01/02/2021, 2:27 PM

## 2021-01-02 NOTE — TOC Initial Note (Signed)
Transition of Care [facility]) - Initial/Assessment Note    Patient Details  Name: Priscilla Moore MRN: [id number] Date of Birth: 08/26/1975  Transition of [facility]) CM/SW Contact:    Chapman Fitch, RN Phone Number: 01/02/2021, 10:17 AM  Clinical Narrative:                 Patient admitted from home with  colitis.  Found to have salmonella.  Cultures pending Patient states that she lives at home with daughter  PCP Clearance Coots - denies issues with transportation  Pharmacy [facility]- There was [facility] consult for medication assistance.  Patient denies any issues obtaining her medications  Please Consult TOC should any needs arise   Expected Discharge Plan: Home/Self Care     Patient Goals and CMS Choice        Expected Discharge Plan and Services Expected Discharge Plan: Home/Self Care   Discharge Planning Services: CM Consult   Living arrangements for the past 2 months: Single Family Home                                      Prior Living Arrangements/Services Living arrangements for the past 2 months: Single Family Home Lives with:: Minor Children Patient language and need for interpreter reviewed:: Yes Do you feel safe going back to the place where you live?: Yes      Need for Family Participation in Patient Care: No (Comment) Care giver support system in place?: No (comment)   Criminal Activity/Legal Involvement Pertinent to Current Situation/Hospitalization: No - Comment as needed  Activities of Daily Living Home Assistive Devices/Equipment: None ADL Screening (condition at time of admission) Patient's cognitive ability adequate to safely complete daily activities?: Yes Is the patient deaf or have difficulty hearing?: No Does the patient have difficulty seeing, even when wearing glasses/contacts?: No Does the patient have difficulty concentrating, remembering, or making decisions?: No Patient able to express need for assistance with ADLs?: No Does the patient have  difficulty dressing or bathing?: No Independently performs ADLs?: Yes (appropriate for developmental age) Does the patient have difficulty walking or climbing stairs?: No Weakness of Legs: None Weakness of Arms/Hands: None  Permission Sought/Granted                  Emotional Assessment Appearance:: Appears older than stated age     Orientation: : Oriented to Self, Oriented to Place, Oriented to  Time, Oriented to Situation Alcohol / Substance Use: Not Applicable Psych Involvement: No (comment)  Admission diagnosis:  Colitis [K52.9] Tachycardia [R00.0] Generalized abdominal pain [R10.84] Nausea vomiting and diarrhea [R11.2, R19.7] Sepsis (HCC) [A41.9] Sepsis, due to unspecified organism, unspecified whether acute organ dysfunction present [facility]) [A41.9] Patient Active Problem List   Diagnosis Date Noted   Colitis 12/29/2020   Severe sepsis (HCC) 12/29/2020   Abdominal pain, vomiting, and diarrhea 12/29/2020   AKI (acute kidney injury) (HCC) 12/29/2020   Dyslipidemia 12/29/2020   COPD (chronic obstructive pulmonary disease) (HCC) 12/29/2020   Alpha thalassemia (HCC) 12/20/2020   B12 deficiency 09/16/2019   Type 2 diabetes mellitus with hyperglycemia, without long-term current use of insulin (HCC) 09/16/2019   Cigarette nicotine dependence without complication 08/27/2019   Bipolar 1 disorder (HCC) 12/19/2013   DDD (degenerative disc disease), lumbar 12/19/2013   DDD (degenerative disc disease), cervical 12/19/2013   Gastroesophageal reflux disease without esophagitis 12/19/2013   Narcotic drug use 06/13/2013   Fibromyalgia 04/02/2013  Obstructive sleep apnea 07/10/2008   PCP:  [facility] Pharmacy:  No Pharmacies Listed    Social Determinants of Health (SDOH) Interventions    Readmission Risk Interventions No flowsheet data found.

## 2021-01-03 DIAGNOSIS — A020 Salmonella enteritis: Secondary | ICD-10-CM | POA: Diagnosis present

## 2021-01-03 LAB — BASIC METABOLIC PANEL WITH GFR
Anion gap: 7 (ref 5–15)
BUN: <5 mg/dL — ABNORMAL LOW (ref 6–20)
CO2: 27 mmol/L (ref 22–32)
Calcium: 8.2 mg/dL — ABNORMAL LOW (ref 8.9–10.3)
Chloride: 107 mmol/L (ref 98–111)
Creatinine, Ser: 0.83 mg/dL (ref 0.44–1.00)
GFR, Estimated: >60 mL/min (ref >60)
Glucose, Bld: 116 mg/dL — ABNORMAL HIGH (ref 70–99)
Potassium: 3.8 mmol/L (ref 3.5–5.1)
Sodium: 141 mmol/L (ref 135–145)

## 2021-01-03 LAB — GLUCOSE, CAPILLARY
Glucose-Capillary: 109 mg/dL — ABNORMAL HIGH (ref 70–99)
Glucose-Capillary: 159 mg/dL — ABNORMAL HIGH (ref 70–99)
Glucose-Capillary: 135 mg/dL — ABNORMAL HIGH (ref 70–99)
Glucose-Capillary: 157 mg/dL — ABNORMAL HIGH (ref 70–99)

## 2021-01-03 LAB — PHOSPHORUS: Phosphorus: 4.2 mg/dL (ref 2.5–4.6)

## 2021-01-03 LAB — MAGNESIUM: Magnesium: 1.7 mg/dL (ref 1.7–2.4)

## 2021-01-03 MED ORDER — CHOLESTYRAMINE 4 G PO PACK
4.0000 g | PACK | Freq: Three times a day (TID) | ORAL | Status: DC
  Administered 2021-01-03 – 2021-01-04 (×3): 4 g via ORAL
  Filled 2021-01-03 (×5): qty 1

## 2021-01-03 MED ORDER — AZITHROMYCIN 250 MG PO TABS
500.0000 mg | ORAL_TABLET | Freq: Every day | ORAL | Status: DC
  Administered 2021-01-03: 500 mg via ORAL
  Filled 2021-01-03: qty 2

## 2021-01-03 NOTE — Progress Notes (Addendum)
   Date of Admission:  12/29/2020     ID: Priscilla Moore is a 45 y.o. female Active Problems:   Bipolar 1 disorder (HCC)   Type 2 diabetes mellitus with hyperglycemia, without long-term current use of insulin (HCC)   Fibromyalgia   Colitis   Severe sepsis (HCC)   Abdominal pain, vomiting, and diarrhea   AKI (acute kidney injury) (HCC)   Dyslipidemia   COPD (chronic obstructive pulmonary disease) (HCC)   Salmonella enteritis    Subjective: Continues to have diarrhea.  Even though much less than before says she has had at least 10 episodes today Some pain in the abdomen No fever.  Medications:   DULoxetine  20 mg Oral BID   enoxaparin (LOVENOX) injection  0.5 mg/kg Subcutaneous Q24H   gabapentin  900 mg Oral TID   insulin aspart  0-15 Units Subcutaneous TID WC   insulin glargine  10 Units Subcutaneous Daily   mometasone-formoterol  2 puff Inhalation BID   pantoprazole  40 mg Oral Daily   tiotropium  18 mcg Inhalation Daily   traZODone  50 mg Oral QHS    Objective: Vital signs in last 24 hours: Temp:  [97.9 F (36.6 C)-98.6 F (37 C)] 98 F (36.7 C) (06/27 0809) Pulse Rate:  [77-90] 84 (06/27 0809) Resp:  [15-18] 18 (06/27 0809) BP: (102-124)/(53-91) 124/91 (06/27 0809) SpO2:  [97 %-100 %] 100 % (06/27 0809)  PHYSICAL EXAM:  General: Alert, cooperative, Lungs: Clear to auscultation bilaterally. No Wheezing or Rhonchi. No rales. Heart: Regular rate and rhythm, no murmur, rub or gallop. Abdomen: Soft, not distended Minimal tenderness in the lower abdomen.  Extremities: atraumatic, no cyanosis. No edema. No clubbing Skin: No rashes or lesions. Or bruising Lymph: Cervical, supraclavicular normal. Neurologic: Grossly non-focal  Lab Results Recent Labs    01/02/21 0820 01/03/21 0738  NA 137 141  K 3.2* 3.8  CL 102 107  CO2 27 27  BUN <5* <5*  CREATININE 0.78 0.83   Microbiology:  Studies/Results: No results found.   Assessment/Plan: Gastroenteritis  due to Salmonella.  On day 5 of IV ciprofloxacin.  She continues to have diarrhea which is reduced by 50%.  We will stop the ciprofloxacin and change to p.o. azithromycin for couple more days  Gram-positive cocci in blood culture turned out to be staph hominis in 1 bottle and staff epidermidis in the second set.  This is contamination.  So vancomycin was discontinued over the weekend.  AKI has resolved.  COPD  Fibromyalgia  Discussed the management with the patient and the hospitalist.

## 2021-01-03 NOTE — Progress Notes (Addendum)
Progress Note    Priscilla DixonLatricia Gastelum  ZOX:[id number] DOB: April 30, 1976  DOA: 12/29/2020 [place]: [place], [facility]      Brief Narrative:    Medical records reviewed and are as summarized below:  Priscilla DixonLatricia Flam is a 45 y.o. female with medical history significant for type 2 diabetes mellitus, hyperlipidemia, hypertension, fibromyalgia, bipolar disorder, tachycardia, who presented to the hospital with a 2-day history of vomiting, watery nonbloody diarrhea, nausea and abdominal pain.  She was admitted to the hospital for severe sepsis secondary to acute colitis complicated by AKI.  GI panel showed Salmonella.  Stool for C. difficile toxin was negative.  Blood culture showed staph epidermis and staph hominis which were thought to be contaminants.  She was treated with analgesics, IV fluids and empiric IV antibiotics.    Assessment/Plan:   Active Problems:   Bipolar 1 disorder (HCC)   Type 2 diabetes mellitus with hyperglycemia, without long-term current use of insulin (HCC)   Fibromyalgia   Colitis   Severe sepsis (HCC)   Abdominal pain, vomiting, and diarrhea   AKI (acute kidney injury) (HCC)   Dyslipidemia   COPD (chronic obstructive pulmonary disease) (HCC)   Salmonella enteritis   Body mass index is 44.11 kg/m.  (Morbid obesity)  Severe sepsis secondary to acute Salmonella gastroenteritis/colitis, severe diarrhea: Continue IV ciprofloxacin, analgesics as needed for pain and Lomotil as needed for diarrhea.  No evidence of vegetations on 2D echo but there is evidence of grade 2 diastolic dysfunction.  Initial blood culture showed staph epidermidis and staph hominis which are likely contaminants.  No growth on repeat blood cultures thus far.  AKI: Improved.  Continue IV fluids because of ongoing diarrhea.  Hypomagnesemia, hypokalemia and hypophosphatemia: Improved.  Monitor electrolytes.  Abnormal urinalysis but no growth on urine culture.  No UTI.  Type II DM with  hyperglycemia: Continue Lantus and NovoLog.  COPD: Stable.  Continue bronchodilators.  Elevated liver enzymes: This is likely from hepatic steatosis.  Other comorbidities include hepatic steatosis, fibromyalgia, history of tachycardia  Possible discharge to home tomorrow if diarrhea improves.  Diet Order             Diet regular Room service appropriate? Yes; Fluid consistency: Thin  Diet effective now                      Consultants: Infectious diseases  Procedures: None    Medications:    DULoxetine  20 mg Oral BID   enoxaparin (LOVENOX) injection  0.5 mg/kg Subcutaneous Q24H   gabapentin  900 mg Oral TID   insulin aspart  0-15 Units Subcutaneous TID WC   insulin glargine  10 Units Subcutaneous Daily   mometasone-formoterol  2 puff Inhalation BID   pantoprazole  40 mg Oral Daily   tiotropium  18 mcg Inhalation Daily   traZODone  50 mg Oral QHS   Continuous Infusions:  ciprofloxacin 400 mg (01/03/21 0147)     Anti-infectives (From admission, onward)    Start     Dose/Rate Route Frequency Ordered Stop   01/01/21 1600  vancomycin (VANCOREADY) IVPB 1000 mg/200 mL  Status:  Discontinued        1,000 mg 200 mL/hr over 60 Minutes Intravenous Every 12 hours 01/01/21 1510 01/01/21 1510   12/31/20 2200  vancomycin (VANCOREADY) IVPB 1750 mg/350 mL  Status:  Discontinued        1,750 mg 175 mL/hr over 120 Minutes Intravenous Every 24 hours 12/30/20 1954  12/31/20 0744   12/31/20 1800  vancomycin (VANCOREADY) IVPB 1750 mg/350 mL  Status:  Discontinued        1,750 mg 175 mL/hr over 120 Minutes Intravenous Every 24 hours 12/30/20 1626 12/30/20 1954   12/31/20 1200  vancomycin (VANCOREADY) IVPB 1000 mg/200 mL  Status:  Discontinued        1,000 mg 200 mL/hr over 60 Minutes Intravenous Every 12 hours 12/31/20 0744 12/31/20 0814   12/31/20 1200  vancomycin (VANCOCIN) IVPB 1000 mg/200 mL premix  Status:  Discontinued        1,000 mg 200 mL/hr over 60 Minutes  Intravenous Every 12 hours 12/31/20 0814 01/02/21 0942   12/30/20 2200  vancomycin (VANCOCIN) IVPB 1000 mg/200 mL premix        1,000 mg 200 mL/hr over 60 Minutes Intravenous  Once 12/30/20 1953 12/31/20 0022   12/30/20 2000  vancomycin (VANCOREADY) IVPB 1500 mg/300 mL        1,500 mg 150 mL/hr over 120 Minutes Intravenous  Once 12/30/20 1953 12/30/20 2216   12/30/20 1830  vancomycin (VANCOCIN) IVPB 1000 mg/200 mL premix  Status:  Discontinued        1,000 mg 200 mL/hr over 60 Minutes Intravenous  Once 12/30/20 1633 12/30/20 1953   12/30/20 1715  vancomycin (VANCOREADY) IVPB 1000 mg/200 mL  Status:  Discontinued        1,000 mg 200 mL/hr over 60 Minutes Intravenous  Once 12/30/20 1622 12/30/20 1632   12/30/20 1715  vancomycin (VANCOREADY) IVPB 1500 mg/300 mL  Status:  Discontinued        1,500 mg 150 mL/hr over 120 Minutes Intravenous  Once 12/30/20 1624 12/30/20 1953   12/30/20 1630  vancomycin (VANCOCIN) 2,500 mg in sodium chloride 0.9 % 500 mL IVPB  Status:  Discontinued        2,500 mg 250 mL/hr over 120 Minutes Intravenous  Once 12/30/20 1620 12/30/20 1622   12/30/20 0110  ciprofloxacin (CIPRO) IVPB 400 mg        400 mg 200 mL/hr over 60 Minutes Intravenous Every 12 hours 12/30/20 0110     12/30/20 0110  metroNIDAZOLE (FLAGYL) IVPB 500 mg  Status:  Discontinued        500 mg 100 mL/hr over 60 Minutes Intravenous Every 8 hours 12/30/20 0110 12/30/20 1631   12/29/20 2100  ciprofloxacin (CIPRO) IVPB 400 mg        400 mg 200 mL/hr over 60 Minutes Intravenous  Once 12/29/20 2047 12/29/20 2339   12/29/20 2100  metroNIDAZOLE (FLAGYL) IVPB 500 mg        500 mg 100 mL/hr over 60 Minutes Intravenous  Once 12/29/20 2047 12/29/20 2339              Family Communication/Anticipated D/C date and plan/Code Status   DVT prophylaxis:      Code Status: Full Code  Family Communication: None Disposition Plan:    Status is: Inpatient  Remains inpatient appropriate because:IV  treatments appropriate due to intensity of illness or inability to take PO  Dispo: The patient is from: Home              Anticipated d/c is to: Home              Patient currently is not medically stable to d/c.   Difficult to place patient No           Subjective:   Interval events noted.  She continues to have profuse watery  diarrhea.  She had multiple watery stools overnight.  She still has nausea and abdominal pain.  No vomiting.  She feels weak and tired.  Objective:    Vitals:   01/02/21 1613 01/02/21 1939 01/03/21 0435 01/03/21 0809  BP: (!) 113/59 (!) 107/53 (!) 102/54 (!) 124/91  Pulse: 85 77 90 84  Resp: Temp: 98.1 F (36.7 C) 98.6 F (37 C) 97.9 F (36.6 C) 98 F (36.7 C)  TempSrc: Oral Oral Oral Oral  SpO2: 99% 100% 97% 100%  Weight:      Height:       No data found.   Intake/Output Summary (Last 24 hours) at 01/03/2021 1044 Last data filed at 01/03/2021 0545 Gross per 24 hour  Intake 2231.44 ml  Output 0 ml  Net 2231.44 ml    Filed Weights   12/29/20 1730 12/29/20 1808  Weight: 121.6 kg 131.6 kg    Exam:  GEN: NAD SKIN: Warm and dry EYES: EOMI ENT: MMM CV: RRR PULM: CTA B ABD: soft, obese, mild to moderate mid abdominal tenderness, no rebound tenderness or guarding,, +BS CNS: AAO x 3, non focal EXT: No edema or tenderness             Data Reviewed:   I have personally reviewed following labs and imaging studies:  Labs: Labs show the following:   Basic Metabolic Panel: Recent Labs  Lab 12/30/20 1441 12/31/20 0538 01/01/21 0647 01/02/21 0820 01/03/21 0738  NA 131* 132* 135 137 141  K 3.5 3.5 3.3* 3.2* 3.8  CL 104 103 104 102 107  CO2 20* GLUCOSE 189* 174* 134* 148* 116*  BUN 15 12 5* <5* <5*  CREATININE 1.15* 0.99 0.87 0.78 0.83  CALCIUM 7.7* 7.8* 7.5* 8.0* 8.2*  MG  --  1.6* 2.1 2.1 1.7  PHOS  --  1.9* 2.1* 3.4 4.2   GFR Estimated Creatinine Clearance: 120.4 mL/min (by C-G  formula based on SCr of 0.83 mg/dL). Liver Function Tests: Recent Labs  Lab 12/29/20 1859 12/31/20 0538  AST 52* 62*  ALT 48* 50*  ALKPHOS 71 53  BILITOT 0.9 0.8  PROT 7.7 6.9  ALBUMIN 3.3* 2.9*   Recent Labs  Lab 12/29/20 1859  LIPASE 20   No results for input(s): AMMONIA in the last 168 hours. Coagulation profile Recent Labs  Lab 12/30/20 1441  INR 1.3*    CBC: Recent Labs  Lab 12/29/20 1733 12/30/20 1441  WBC 9.5 6.5  HGB 14.6 12.5  HCT 44.4 38.7  MCV 76.7* 76.5*  PLT 277 208   Cardiac Enzymes: No results for input(s): CKTOTAL, CKMB, CKMBINDEX, TROPONINI in the last 168 hours. BNP (last 3 results) No results for input(s): PROBNP in the last 8760 hours. CBG: Recent Labs  Lab 01/02/21 0823 01/02/21 1155 01/02/21 1614 01/02/21 2035 01/03/21 0758  GLUCAP 149* 146* 124* 188* 109*   D-Dimer: No results for input(s): DDIMER in the last 72 hours. Hgb A1c: No results for input(s): HGBA1C in the last 72 hours.  Lipid Profile: No results for input(s): CHOL, HDL, LDLCALC, TRIG, CHOLHDL, LDLDIRECT in the last 72 hours. Thyroid function studies: No results for input(s): TSH, T4TOTAL, T3FREE, THYROIDAB in the last 72 hours.  Invalid input(s): FREET3 Anemia work up: No results for input(s): VITAMINB12, FOLATE, FERRITIN, TIBC, IRON, RETICCTPCT in the last 72 hours. Sepsis Labs: Recent Labs  Lab 12/29/20 1733 12/29/20 1834 12/29/20 2143 12/30/20 1441  WBC 9.5  --   --  6.5  LATICACIDVEN  --  3.8* 2.1*  --     Microbiology Recent Results (from the past 240 hour(s))  Resp Panel by RT-PCR (Flu A&B, Covid) Nasopharyngeal Swab     Status: None   Collection Time: 12/29/20  6:06 PM   Specimen: Nasopharyngeal Swab; Nasopharyngeal(NP) swabs in vial transport medium  Result Value Ref Range Status   SARS Coronavirus 2 by RT PCR NEGATIVE NEGATIVE Final    Comment: (NOTE) SARS-CoV-2 target nucleic acids are NOT DETECTED.  The SARS-CoV-2 RNA is generally  detectable in upper respiratory specimens during the acute phase of infection. The lowest concentration of SARS-CoV-2 viral copies this assay can detect is 138 copies/mL. A negative result does not preclude SARS-Cov-2 infection and should not be used as the sole basis for treatment or other patient management decisions. A negative result may occur with  improper specimen collection/handling, submission of specimen other than nasopharyngeal swab, presence of viral mutation(s) within the areas targeted by this assay, and inadequate number of viral copies(<138 copies/mL). A negative result must be combined with clinical observations, patient history, and epidemiological information. The expected result is Negative.  Fact Sheet for Patients:  BloggerCourse.com  Fact Sheet for Healthcare Providers:  SeriousBroker.it  This test is no t yet approved or cleared by the [place] FDA and  has been authorized for detection and/or diagnosis of SARS-CoV-2 by FDA under an Emergency Use Authorization (EUA). This EUA will remain  in effect (meaning this test can be used) for the duration of the COVID-19 declaration under Section 564(b)(1) of the Act, 21 U.S.C.section 360bbb-3(b)(1), unless the authorization is terminated  or revoked sooner.       Influenza A by PCR NEGATIVE NEGATIVE Final   Influenza B by PCR NEGATIVE NEGATIVE Final    Comment: (NOTE) The Xpert Xpress SARS-CoV-2/FLU/RSV plus assay is intended as an aid in the diagnosis of influenza from Nasopharyngeal swab specimens and should not be used as a sole basis for treatment. Nasal washings and aspirates are unacceptable for Xpert Xpress SARS-CoV-2/FLU/RSV testing.  Fact Sheet for Patients: BloggerCourse.com  Fact Sheet for Healthcare Providers: SeriousBroker.it  This test is not yet approved or cleared by the [place] FDA  and has been authorized for detection and/or diagnosis of SARS-CoV-2 by FDA under an Emergency Use Authorization (EUA). This EUA will remain in effect (meaning this test can be used) for the duration of the COVID-19 declaration under Section 564(b)(1) of the Act, 21 U.S.C. section 360bbb-3(b)(1), unless the authorization is terminated or revoked.  Performed at [facility], [address] [place], [place] [address]   Culture, blood (routine x 2)     Status: Abnormal   Collection Time: 12/29/20  9:44 PM   Specimen: BLOOD  Result Value Ref Range Status   Specimen Description   Final    BLOOD LEFT ASSIST CONTROL Performed at [facility], [address] [place], [place] [address]    Special Requests   Final    BOTTLES DRAWN AEROBIC AND ANAEROBIC Blood Culture adequate volume Performed at [facility], [address] [place], [place] [address]    Culture  Setup Time   Final    IN BOTH AEROBIC AND ANAEROBIC BOTTLES GRAM POSITIVE COCCI CRITICAL RESULT CALLED TO, READ BACK BY AND VERIFIED WITH: Nash Mantis 12/30/20 1522 KLW Performed at [facility], [address], [place], [place] [address]    Culture STAPHYLOCOCCUS HOMINIS (A)  Final   Report Status 01/02/2021  FINAL  Final   Organism ID, Bacteria STAPHYLOCOCCUS HOMINIS  Final      Susceptibility   Staphylococcus hominis - MIC*    CIPROFLOXACIN <=0.5 SENSITIVE Sensitive     ERYTHROMYCIN >=8 RESISTANT Resistant     GENTAMICIN <=0.5 SENSITIVE Sensitive     OXACILLIN <=0.25 SENSITIVE Sensitive     TETRACYCLINE 2 SENSITIVE Sensitive     VANCOMYCIN <=0.5 SENSITIVE Sensitive     TRIMETH/SULFA <=10 SENSITIVE Sensitive     CLINDAMYCIN 4 RESISTANT Resistant     RIFAMPIN <=0.5 SENSITIVE Sensitive     Inducible Clindamycin NEGATIVE Sensitive     * STAPHYLOCOCCUS HOMINIS  Culture, blood (routine x 2)     Status: Abnormal   Collection Time: 12/29/20  9:44 PM   Specimen: BLOOD  Result Value Ref  Range Status   Specimen Description   Final    BLOOD RIGHT ASSIST CONTROL Performed at [facility], [address] [place], [place] [address]    Special Requests   Final    BOTTLES DRAWN AEROBIC AND ANAEROBIC Blood Culture adequate volume Performed at [facility], [address] [place], [place] [address]    Culture  Setup Time   Final    GRAM POSITIVE COCCI IN CLUSTERS AEROBIC BOTTLE ONLY CRITICAL RESULT CALLED TO, READ BACK BY AND VERIFIED WITH: Nash Mantis 12/30/20 1545 JGF Performed at [facility], [address] [place], [place] [address]    Culture (A)  Final    STAPHYLOCOCCUS EPIDERMIDIS THE SIGNIFICANCE OF ISOLATING THIS ORGANISM FROM A SINGLE SET OF BLOOD CULTURES WHEN MULTIPLE SETS ARE DRAWN IS UNCERTAIN. PLEASE NOTIFY THE MICROBIOLOGY DEPARTMENT WITHIN ONE WEEK IF SPECIATION AND SENSITIVITIES ARE REQUIRED. STAPHYLOCOCCUS HOMINIS SUSCEPTIBILITIES PERFORMED ON PREVIOUS CULTURE WITHIN THE LAST 5 DAYS. Performed at [facility], [address] [place], [place] [address]    Report Status 01/02/2021 FINAL  Final  Blood Culture ID Panel (Reflexed)     Status: Abnormal   Collection Time: 12/29/20  9:44 PM  Result Value Ref Range Status   Enterococcus faecalis NOT DETECTED NOT DETECTED Final   Enterococcus Faecium NOT DETECTED NOT DETECTED Final   Listeria monocytogenes NOT DETECTED NOT DETECTED Final   Staphylococcus species DETECTED (A) NOT DETECTED Final    Comment: CRITICAL RESULT CALLED TO, READ BACK BY AND VERIFIED WITH: Lelon Mast RAUAR 12/30/20 1522 KLW    Staphylococcus aureus (BCID) NOT DETECTED NOT DETECTED Final   Staphylococcus epidermidis NOT DETECTED NOT DETECTED Final   Staphylococcus lugdunensis NOT DETECTED NOT DETECTED Final   Streptococcus species NOT DETECTED NOT DETECTED Final   Streptococcus agalactiae NOT DETECTED NOT DETECTED Final   Streptococcus pneumoniae NOT DETECTED NOT DETECTED Final   Streptococcus  pyogenes NOT DETECTED NOT DETECTED Final   A.calcoaceticus-baumannii NOT DETECTED NOT DETECTED Final   Bacteroides fragilis NOT DETECTED NOT DETECTED Final   Enterobacterales NOT DETECTED NOT DETECTED Final   Enterobacter cloacae complex NOT DETECTED NOT DETECTED Final   Escherichia coli NOT DETECTED NOT DETECTED Final   Klebsiella aerogenes NOT DETECTED NOT DETECTED Final   Klebsiella oxytoca NOT DETECTED NOT DETECTED Final   Klebsiella pneumoniae NOT DETECTED NOT DETECTED Final   Proteus species NOT DETECTED NOT DETECTED Final   Salmonella species NOT DETECTED NOT DETECTED Final   Serratia marcescens NOT DETECTED NOT DETECTED Final   Haemophilus influenzae NOT DETECTED NOT DETECTED Final   Neisseria meningitidis NOT DETECTED NOT DETECTED Final   Pseudomonas aeruginosa NOT DETECTED NOT DETECTED Final  Stenotrophomonas maltophilia NOT DETECTED NOT DETECTED Final   Candida albicans NOT DETECTED NOT DETECTED Final   Candida auris NOT DETECTED NOT DETECTED Final   Candida glabrata NOT DETECTED NOT DETECTED Final   Candida krusei NOT DETECTED NOT DETECTED Final   Candida parapsilosis NOT DETECTED NOT DETECTED Final   Candida tropicalis NOT DETECTED NOT DETECTED Final   Cryptococcus neoformans/gattii NOT DETECTED NOT DETECTED Final    Comment: Performed at [facility], [address] [place], [place] [address]  C Difficile Quick Screen w PCR reflex     Status: None   Collection Time: 12/30/20  6:19 AM   Specimen: Stool  Result Value Ref Range Status   C Diff antigen NEGATIVE NEGATIVE Final   C Diff toxin NEGATIVE NEGATIVE Final   C Diff interpretation No C. difficile detected.  Final    Comment: Performed at [facility], [address] [place], [place] [address]  Gastrointestinal Panel by PCR , Stool     Status: Abnormal   Collection Time: 12/30/20  6:19 AM   Specimen: Stool  Result Value Ref Range Status   Campylobacter species NOT DETECTED NOT DETECTED Final    Plesimonas shigelloides NOT DETECTED NOT DETECTED Final   Salmonella species DETECTED (A) NOT DETECTED Final    Comment: RESULT CALLED TO, READ BACK BY AND VERIFIED WITH: C.GUALDONI,RN 0945 12/30/20 GM    Yersinia enterocolitica NOT DETECTED NOT DETECTED Final   Vibrio species NOT DETECTED NOT DETECTED Final   Vibrio cholerae NOT DETECTED NOT DETECTED Final   Enteroaggregative E coli (EAEC) NOT DETECTED NOT DETECTED Final   Enteropathogenic E coli (EPEC) NOT DETECTED NOT DETECTED Final   Enterotoxigenic E coli (ETEC) NOT DETECTED NOT DETECTED Final   Shiga like toxin producing E coli (STEC) NOT DETECTED NOT DETECTED Final   Shigella/Enteroinvasive E coli (EIEC) NOT DETECTED NOT DETECTED Final   Cryptosporidium NOT DETECTED NOT DETECTED Final   Cyclospora cayetanensis NOT DETECTED NOT DETECTED Final   Entamoeba histolytica NOT DETECTED NOT DETECTED Final   Giardia lamblia NOT DETECTED NOT DETECTED Final   Adenovirus F40/41 NOT DETECTED NOT DETECTED Final   Astrovirus NOT DETECTED NOT DETECTED Final   Norovirus GI/GII NOT DETECTED NOT DETECTED Final   Rotavirus A NOT DETECTED NOT DETECTED Final   Sapovirus (I, II, IV, and V) NOT DETECTED NOT DETECTED Final    Comment: Performed at [facility], [address], [place], [place] [address]  Urine Culture     Status: Abnormal   Collection Time: 12/30/20  6:23 AM   Specimen: Urine, Random  Result Value Ref Range Status   Specimen Description   Final    URINE, RANDOM Performed at [facility], [address] [place], [place] [address]    Special Requests   Final    NONE Performed at [facility], [address], [place], [place] [address]    Culture (A)  Final    <10,000 COLONIES/mL INSIGNIFICANT GROWTH Performed at [facility], [address] [place], [place] [address]    Report Status 12/31/2020 FINAL  Final  CULTURE, BLOOD (ROUTINE X 2) w Reflex to ID Panel     Status: None (Preliminary  result)   Collection Time: 12/30/20  5:11 PM   Specimen: BLOOD  Result Value Ref Range Status   Specimen Description BLOOD RIGHT ANTECUBITAL  Final   Special Requests   Final    BOTTLES DRAWN AEROBIC AND ANAEROBIC Blood Culture adequate volume  Culture   Final    NO GROWTH 4 DAYS Performed at [facility], [address] [place], [place] [address]    Report Status PENDING  Incomplete  CULTURE, BLOOD (ROUTINE X 2) w Reflex to ID Panel     Status: None (Preliminary result)   Collection Time: 12/30/20  7:06 PM   Specimen: BLOOD  Result Value Ref Range Status   Specimen Description BLOOD BLOOD LEFT HAND  Final   Special Requests   Final    BOTTLES DRAWN AEROBIC AND ANAEROBIC Blood Culture results may not be optimal due to an inadequate volume of blood received in culture bottles   Culture   Final    NO GROWTH 4 DAYS Performed at [facility], [address] [place], [place] [address]    Report Status PENDING  Incomplete    Procedures and diagnostic studies:  No results found.             LOS: 5 days   Leesha Veno  [facility]   Pager on [email or web address]. If 7PM-7AM, please contact night-coverage at [email or web address]     01/03/2021, 10:44 AM

## 2021-01-04 LAB — CULTURE, BLOOD (ROUTINE X 2)
Culture: NO GROWTH
Culture: NO GROWTH
Special Requests: ADEQUATE

## 2021-01-04 LAB — GLUCOSE, CAPILLARY
Glucose-Capillary: 131 mg/dL — ABNORMAL HIGH (ref 70–99)
Glucose-Capillary: 183 mg/dL — ABNORMAL HIGH (ref 70–99)

## 2021-01-04 MED ORDER — AZITHROMYCIN 500 MG PO TABS: 500.0000 mg | ORAL_TABLET | Freq: Every day | ORAL | 0 refills | Status: AC

## 2021-01-04 MED ORDER — HYDROCODONE-ACETAMINOPHEN 5-325 MG PO TABS: 1.0000 | ORAL_TABLET | Freq: Three times a day (TID) | ORAL | 0 refills | Status: DC | PRN

## 2021-01-04 MED ORDER — FLUCONAZOLE 100 MG PO TABS: 100.0000 mg | ORAL_TABLET | Freq: Once | ORAL | 0 refills | Status: AC

## 2021-01-04 MED ORDER — DIPHENOXYLATE-ATROPINE 2.5-0.025 MG PO TABS: 1.0000 | ORAL_TABLET | Freq: Four times a day (QID) | ORAL | 0 refills | Status: AC | PRN

## 2021-01-04 NOTE — Discharge Summary (Signed)
Physician Discharge Summary  Priscilla Moore WUJ:[id number] DOB: [birth year] DOA: 12/29/2020  PCP: [facility]  Admit date: 12/29/2020 Discharge date: 01/04/2021  Discharge disposition: Home   Recommendations for Outpatient Follow-Up:   Follow-up with PCP in 1 week   Discharge Diagnosis:   Principal Problem:   Severe sepsis (HCC) Active Problems:   Bipolar 1 disorder (HCC)   Type 2 diabetes mellitus with hyperglycemia, without long-term current use of insulin (HCC)   Fibromyalgia   Colitis   Abdominal pain, vomiting, and diarrhea   AKI (acute kidney injury) (HCC)   Dyslipidemia   COPD (chronic obstructive pulmonary disease) (HCC)   Salmonella enteritis    Discharge Condition: Stable.  Diet recommendation:  Diet Order             Diet - low sodium heart healthy           Diet Carb Modified           Diet regular Room service appropriate? Yes; Fluid consistency: Thin  Diet effective now                     Code Status: Full Code     Hospital Course:   Ms. Priscilla Moore is a 45 y.o. female with medical history significant for type 2 diabetes mellitus, hyperlipidemia, hypertension, fibromyalgia, bipolar disorder, tachycardia, who presented to the hospital with a 2-day history of vomiting, watery nonbloody diarrhea, nausea and abdominal pain.   She was admitted to the hospital for severe sepsis secondary to acute colitis complicated by AKI.  GI panel showed Salmonella.  Stool for C. difficile toxin was negative.  Blood culture showed staph epidermis and staph hominis which were thought to be contaminants.  She was treated with analgesics, IV fluids and empiric IV antibiotics.  She had hypokalemia, hypomagnesemia and hypophosphatemia that were repleted.  Her condition has improved and she is deemed stable for discharge to home today.  Case was discussed with Dr. Joylene Draft, ID specialist, on the day of discharge.  She recommended discharging  patient on 2 doses of azithromycin.  Patient requested excuse duty from work and she was given a work note for this.        Medical Consultants:   Infectious disease specialist   Discharge Exam:    Vitals:   01/03/21 0809 01/03/21 2050 01/04/21 0530 01/04/21 0700  BP: (!) 124/91 113/64 128/88 109/71  Pulse: 84 86 72 74  Resp: Temp: 98 F (36.7 C) 98.5 F (36.9 C) 98 F (36.7 C) 98.4 F (36.9 C)  TempSrc: Oral Oral Oral Oral  SpO2: 100% 99% 100% 98%  Weight:      Height:         GEN: NAD SKIN: Warm and dry EYES: No pallor or icterus ENT: MMM CV: RRR PULM: CTA B ABD: soft, obese, NT, +BS CNS: AAO x 3, non focal EXT: No edema or tenderness   The results of significant diagnostics from this hospitalization (including imaging, microbiology, ancillary and laboratory) are listed below for reference.     Procedures and Diagnostic Studies:   ECHOCARDIOGRAM COMPLETE  Result Date: 12/31/2020    ECHOCARDIOGRAM REPORT   Patient Name:   Priscilla Moore Date of Exam: 12/31/2020 Medical Rec #:  [id number]         Height:       68.0 in Accession #:    [id number]        Weight:  290.1 lb Date of Birth:  10/01/1975         BSA:          2.394 m Patient Age:    45 years          BP:           110/76 mmHg Patient Gender: F                 HR:           90 bpm. Exam Location:  [facility] Procedure: 2D Echo, Color Doppler, Cardiac Doppler and Intracardiac            Opacification Agent Indications:     R78.81 Bacteremia  History:         Patient has no prior history of Echocardiogram examinations.                  Risk Factors:HCL.  Sonographer:     Humphrey Rolls RDCS (AE) Referring Phys:  [id number] Lurene Shadow Diagnosing Phys: Yvonne Kendall MD  Sonographer Comments: Technically difficult study due to poor echo windows. Image acquisition challenging due to patient body habitus. IMPRESSIONS  1. Left ventricular ejection fraction, by estimation, is 60 to 65%. The left ventricle has  normal function. Left ventricular endocardial border not optimally defined to evaluate regional wall motion. Left ventricular diastolic parameters are consistent with Grade II diastolic dysfunction (pseudonormalization).  2. Right ventricular systolic function is normal. The right ventricular size is normal. Tricuspid regurgitation signal is inadequate for assessing PA pressure.  3. The mitral valve was not well visualized. No evidence of mitral valve regurgitation. No evidence of mitral stenosis.  4. The aortic valve was not well visualized. Aortic valve regurgitation is not visualized. No aortic stenosis is present. Conclusion(s)/Recommendation(s): Valves not well-visualized to exclude endocarditis. Consider further evaluation with transesophageal echocardiogram as clinically indicated. FINDINGS  Left Ventricle: Left ventricular ejection fraction, by estimation, is 60 to 65%. The left ventricle has normal function. Left ventricular endocardial border not optimally defined to evaluate regional wall motion. Definity contrast agent was given IV to delineate the left ventricular endocardial borders. The left ventricular internal cavity size was normal in size. There is borderline left ventricular hypertrophy. Left ventricular diastolic parameters are consistent with Grade II diastolic dysfunction (pseudonormalization). Right Ventricle: The right ventricular size is normal. No increase in right ventricular wall thickness. Right ventricular systolic function is normal. Tricuspid regurgitation signal is inadequate for assessing PA pressure. Left Atrium: Left atrial size was normal in size. Right Atrium: Right atrial size was normal in size. Pericardium: The pericardium was not well visualized. Mitral Valve: The mitral valve was not well visualized. No evidence of mitral valve regurgitation. No evidence of mitral valve stenosis. MV peak gradient, 2.5 mmHg. The mean mitral valve gradient is 1.0 mmHg. Tricuspid Valve: The  tricuspid valve is not well visualized. Tricuspid valve regurgitation is trivial. Aortic Valve: The aortic valve was not well visualized. Aortic valve regurgitation is not visualized. No aortic stenosis is present. Aortic valve mean gradient measures 6.0 mmHg. Aortic valve peak gradient measures 10.4 mmHg. Aortic valve area, by VTI measures 1.83 cm. Pulmonic Valve: The pulmonic valve was not well visualized. Pulmonic valve regurgitation is not visualized. No evidence of pulmonic stenosis. Aorta: The aortic root is normal in size and structure. IAS/Shunts: The interatrial septum was not well visualized.  LEFT VENTRICLE PLAX 2D LVIDd:         4.30 cm     Diastology LVIDs:  3.00 cm     LV e' medial:    6.64 cm/s LV PW:         1.02 cm     LV E/e' medial:  10.1 LV IVS:        1.00 cm     LV e' lateral:   7.72 cm/s LVOT diam:     1.90 cm     LV E/e' lateral: 8.7 LV SV:         50 LV SV Index:   21 LVOT Area:     2.84 cm  LV Volumes (MOD) LV vol d, MOD A4C: 86.7 ml LV vol s, MOD A4C: 37.7 ml LV SV MOD A4C:     86.7 ml RIGHT VENTRICLE RV Basal diam:  3.00 cm LEFT ATRIUM           Index       RIGHT ATRIUM          Index LA Vol (A4C): 40.8 ml 17.04 ml/m RA Area:     8.89 cm                                   RA Volume:   15.70 ml 6.56 ml/m  AORTIC VALVE                    PULMONIC VALVE AV Area (Vmax):    1.85 cm     PV Vmax:       0.90 m/s AV Area (Vmean):   1.77 cm     PV Vmean:      55.200 cm/s AV Area (VTI):     1.83 cm     PV VTI:        0.115 m AV Vmax:           161.00 cm/s  PV Peak grad:  3.2 mmHg AV Vmean:          113.000 cm/s PV Mean grad:  1.0 mmHg AV VTI:            0.274 m AV Peak Grad:      10.4 mmHg AV Mean Grad:      6.0 mmHg LVOT Vmax:         105.00 cm/s LVOT Vmean:        70.600 cm/s LVOT VTI:          0.177 m LVOT/AV VTI ratio: 0.65  AORTA Ao Root diam: 2.40 cm MITRAL VALVE MV Area (PHT): 4.33 cm    SHUNTS MV Area VTI:   3.49 cm    Systemic VTI:  0.18 m MV Peak grad:  2.5 mmHg    Systemic  Diam: 1.90 cm MV Mean grad:  1.0 mmHg MV Vmax:       0.79 m/s MV Vmean:      53.3 cm/s MV Decel Time: 175 msec MV E velocity: 67.30 cm/s MV A velocity: 56.10 cm/s MV E/A ratio:  1.20 Yvonne Kendall MD Electronically signed by Yvonne Kendall MD Signature Date/Time: 12/31/2020/5:30:50 PM    Final      Labs:   Basic Metabolic Panel: Recent Labs  Lab 12/30/20 1441 12/31/20 0538 01/01/21 0647 01/02/21 0820 01/03/21 0738  NA 131* 132* 135 137 141  K 3.5 3.5 3.3* 3.2* 3.8  CL 104 103 104 102 107  CO2 20* GLUCOSE 189* 174* 134* 148* 116*  BUN 15 12  5* <5* <5*  CREATININE 1.15* 0.99 0.87 0.78 0.83  CALCIUM 7.7* 7.8* 7.5* 8.0* 8.2*  MG  --  1.6* 2.1 2.1 1.7  PHOS  --  1.9* 2.1* 3.4 4.2   GFR Estimated Creatinine Clearance: 120.4 mL/min (by C-G formula based on SCr of 0.83 mg/dL). Liver Function Tests: Recent Labs  Lab 12/29/20 1859 12/31/20 0538  AST 52* 62*  ALT 48* 50*  ALKPHOS 71 53  BILITOT 0.9 0.8  PROT 7.7 6.9  ALBUMIN 3.3* 2.9*   Recent Labs  Lab 12/29/20 1859  LIPASE 20   No results for input(s): AMMONIA in the last 168 hours. Coagulation profile Recent Labs  Lab 12/30/20 1441  INR 1.3*    CBC: Recent Labs  Lab 12/29/20 1733 12/30/20 1441  WBC 9.5 6.5  HGB 14.6 12.5  HCT 44.4 38.7  MCV 76.7* 76.5*  PLT 277 208   Cardiac Enzymes: No results for input(s): CKTOTAL, CKMB, CKMBINDEX, TROPONINI in the last 168 hours. BNP: Invalid input(s): POCBNP CBG: Recent Labs  Lab 01/03/21 1137 01/03/21 1734 01/03/21 2129 01/04/21 0829 01/04/21 1135  GLUCAP 159* 135* 157* 131* 183*   D-Dimer No results for input(s): DDIMER in the last 72 hours. Hgb A1c No results for input(s): HGBA1C in the last 72 hours. Lipid Profile No results for input(s): CHOL, HDL, LDLCALC, TRIG, CHOLHDL, LDLDIRECT in the last 72 hours. Thyroid function studies No results for input(s): TSH, T4TOTAL, T3FREE, THYROIDAB in the last 72 hours.  Invalid input(s):  FREET3 Anemia work up No results for input(s): VITAMINB12, FOLATE, FERRITIN, TIBC, IRON, RETICCTPCT in the last 72 hours. Microbiology Recent Results (from the past 240 hour(s))  Resp Panel by RT-PCR (Flu A&B, Covid) Nasopharyngeal Swab     Status: None   Collection Time: 12/29/20  6:06 PM   Specimen: Nasopharyngeal Swab; Nasopharyngeal(NP) swabs in vial transport medium  Result Value Ref Range Status   SARS Coronavirus 2 by RT PCR NEGATIVE NEGATIVE Final    Comment: (NOTE) SARS-CoV-2 target nucleic acids are NOT DETECTED.  The SARS-CoV-2 RNA is generally detectable in upper respiratory specimens during the acute phase of infection. The lowest concentration of SARS-CoV-2 viral copies this assay can detect is 138 copies/mL. A negative result does not preclude SARS-Cov-2 infection and should not be used as the sole basis for treatment or other patient management decisions. A negative result may occur with  improper specimen collection/handling, submission of specimen other than nasopharyngeal swab, presence of viral mutation(s) within the areas targeted by this assay, and inadequate number of viral copies(<138 copies/mL). A negative result must be combined with clinical observations, patient history, and epidemiological information. The expected result is Negative.  Fact Sheet for Patients:  BloggerCourse.com  Fact Sheet for Healthcare Providers:  SeriousBroker.it  This test is no t yet approved or cleared by the [place] FDA and  has been authorized for detection and/or diagnosis of SARS-CoV-2 by FDA under an Emergency Use Authorization (EUA). This EUA Moore remain  in effect (meaning this test can be used) for the duration of the COVID-19 declaration under Section 564(b)(1) of the Act, 21 U.S.C.section 360bbb-3(b)(1), unless the authorization is terminated  or revoked sooner.       Influenza A by PCR NEGATIVE NEGATIVE  Final   Influenza B by PCR NEGATIVE NEGATIVE Final    Comment: (NOTE) The Xpert Xpress SARS-CoV-2/FLU/RSV plus assay is intended as an aid in the diagnosis of influenza from Nasopharyngeal swab specimens and should not be used as a  sole basis for treatment. Nasal washings and aspirates are unacceptable for Xpert Xpress SARS-CoV-2/FLU/RSV testing.  Fact Sheet for Patients: BloggerCourse.com  Fact Sheet for Healthcare Providers: SeriousBroker.it  This test is not yet approved or cleared by the [place] FDA and has been authorized for detection and/or diagnosis of SARS-CoV-2 by FDA under an Emergency Use Authorization (EUA). This EUA Moore remain in effect (meaning this test can be used) for the duration of the COVID-19 declaration under Section 564(b)(1) of the Act, 21 U.S.C. section 360bbb-3(b)(1), unless the authorization is terminated or revoked.  Performed at [facility], [address] [place], [place] [address]   Culture, blood (routine x 2)     Status: Abnormal   Collection Time: 12/29/20  9:44 PM   Specimen: BLOOD  Result Value Ref Range Status   Specimen Description   Final    BLOOD LEFT ASSIST CONTROL Performed at [facility], [address] [place], [place] [address]    Special Requests   Final    BOTTLES DRAWN AEROBIC AND ANAEROBIC Blood Culture adequate volume Performed at [facility], [address] [place], [place] [address]    Culture  Setup Time   Final    IN BOTH AEROBIC AND ANAEROBIC BOTTLES GRAM POSITIVE COCCI CRITICAL RESULT CALLED TO, READ BACK BY AND VERIFIED WITH: Nash Mantis 12/30/20 1522 KLW Performed at [facility], [address] [place], [place] [address]    Culture STAPHYLOCOCCUS HOMINIS (A)  Final   Report Status 01/02/2021 FINAL  Final   Organism ID, Bacteria STAPHYLOCOCCUS HOMINIS  Final      Susceptibility   Staphylococcus hominis - MIC*     CIPROFLOXACIN <=0.5 SENSITIVE Sensitive     ERYTHROMYCIN >=8 RESISTANT Resistant     GENTAMICIN <=0.5 SENSITIVE Sensitive     OXACILLIN <=0.25 SENSITIVE Sensitive     TETRACYCLINE 2 SENSITIVE Sensitive     VANCOMYCIN <=0.5 SENSITIVE Sensitive     TRIMETH/SULFA <=10 SENSITIVE Sensitive     CLINDAMYCIN 4 RESISTANT Resistant     RIFAMPIN <=0.5 SENSITIVE Sensitive     Inducible Clindamycin NEGATIVE Sensitive     * STAPHYLOCOCCUS HOMINIS  Culture, blood (routine x 2)     Status: Abnormal   Collection Time: 12/29/20  9:44 PM   Specimen: BLOOD  Result Value Ref Range Status   Specimen Description   Final    BLOOD RIGHT ASSIST CONTROL Performed at [facility], [address], [place], [place] [address]    Special Requests   Final    BOTTLES DRAWN AEROBIC AND ANAEROBIC Blood Culture adequate volume Performed at [facility], [address] [place], [place] [address]    Culture  Setup Time   Final    GRAM POSITIVE COCCI IN CLUSTERS AEROBIC BOTTLE ONLY CRITICAL RESULT CALLED TO, READ BACK BY AND VERIFIED WITH: Nash Mantis 12/30/20 1545 JGF Performed at [facility], [address] [place], [place] [address]    Culture (A)  Final    STAPHYLOCOCCUS EPIDERMIDIS THE SIGNIFICANCE OF ISOLATING THIS ORGANISM FROM A SINGLE SET OF BLOOD CULTURES WHEN MULTIPLE SETS ARE DRAWN IS UNCERTAIN. PLEASE NOTIFY THE MICROBIOLOGY DEPARTMENT WITHIN ONE WEEK IF SPECIATION AND SENSITIVITIES ARE REQUIRED. STAPHYLOCOCCUS HOMINIS SUSCEPTIBILITIES PERFORMED ON PREVIOUS CULTURE WITHIN THE LAST 5 DAYS. Performed at [facility], [address], [place], [place] [address]    Report Status 01/02/2021 FINAL  Final  Blood Culture ID Panel (Reflexed)     Status:  Abnormal   Collection Time: 12/29/20  9:44 PM  Result Value Ref Range Status   Enterococcus faecalis NOT DETECTED NOT DETECTED Final   Enterococcus Faecium NOT DETECTED NOT DETECTED Final   Listeria monocytogenes NOT  DETECTED NOT DETECTED Final   Staphylococcus species DETECTED (A) NOT DETECTED Final    Comment: CRITICAL RESULT CALLED TO, READ BACK BY AND VERIFIED WITH: SAMANTHA RAUAR 12/30/20 1522 KLW    Staphylococcus aureus (BCID) NOT DETECTED NOT DETECTED Final   Staphylococcus epidermidis NOT DETECTED NOT DETECTED Final   Staphylococcus lugdunensis NOT DETECTED NOT DETECTED Final   Streptococcus species NOT DETECTED NOT DETECTED Final   Streptococcus agalactiae NOT DETECTED NOT DETECTED Final   Streptococcus pneumoniae NOT DETECTED NOT DETECTED Final   Streptococcus pyogenes NOT DETECTED NOT DETECTED Final   A.calcoaceticus-baumannii NOT DETECTED NOT DETECTED Final   Bacteroides fragilis NOT DETECTED NOT DETECTED Final   Enterobacterales NOT DETECTED NOT DETECTED Final   Enterobacter cloacae complex NOT DETECTED NOT DETECTED Final   Escherichia coli NOT DETECTED NOT DETECTED Final   Klebsiella aerogenes NOT DETECTED NOT DETECTED Final   Klebsiella oxytoca NOT DETECTED NOT DETECTED Final   Klebsiella pneumoniae NOT DETECTED NOT DETECTED Final   Proteus species NOT DETECTED NOT DETECTED Final   Salmonella species NOT DETECTED NOT DETECTED Final   Serratia marcescens NOT DETECTED NOT DETECTED Final   Haemophilus influenzae NOT DETECTED NOT DETECTED Final   Neisseria meningitidis NOT DETECTED NOT DETECTED Final   Pseudomonas aeruginosa NOT DETECTED NOT DETECTED Final   Stenotrophomonas maltophilia NOT DETECTED NOT DETECTED Final   Candida albicans NOT DETECTED NOT DETECTED Final   Candida auris NOT DETECTED NOT DETECTED Final   Candida glabrata NOT DETECTED NOT DETECTED Final   Candida krusei NOT DETECTED NOT DETECTED Final   Candida parapsilosis NOT DETECTED NOT DETECTED Final   Candida tropicalis NOT DETECTED NOT DETECTED Final   Cryptococcus neoformans/gattii NOT DETECTED NOT DETECTED Final    Comment: Performed at [facility], [address] [place], [place] [address]  C  Difficile Quick Screen w PCR reflex     Status: None   Collection Time: 12/30/20  6:19 AM   Specimen: Stool  Result Value Ref Range Status   C Diff antigen NEGATIVE NEGATIVE Final   C Diff toxin NEGATIVE NEGATIVE Final   C Diff interpretation No C. difficile detected.  Final    Comment: Performed at [facility], [address] [place], [place] [address]  Gastrointestinal Panel by PCR , Stool     Status: Abnormal   Collection Time: 12/30/20  6:19 AM   Specimen: Stool  Result Value Ref Range Status   Campylobacter species NOT DETECTED NOT DETECTED Final   Plesimonas shigelloides NOT DETECTED NOT DETECTED Final   Salmonella species DETECTED (A) NOT DETECTED Final    Comment: RESULT CALLED TO, READ BACK BY AND VERIFIED WITH: C.GUALDONI,RN 0945 12/30/20 GM    Yersinia enterocolitica NOT DETECTED NOT DETECTED Final   Vibrio species NOT DETECTED NOT DETECTED Final   Vibrio cholerae NOT DETECTED NOT DETECTED Final   Enteroaggregative E coli (EAEC) NOT DETECTED NOT DETECTED Final   Enteropathogenic E coli (EPEC) NOT DETECTED NOT DETECTED Final   Enterotoxigenic E coli (ETEC) NOT DETECTED NOT DETECTED Final   Shiga like toxin producing E coli (STEC) NOT DETECTED NOT DETECTED Final   Shigella/Enteroinvasive E coli (EIEC) NOT DETECTED NOT DETECTED Final   Cryptosporidium NOT DETECTED NOT DETECTED Final   Cyclospora cayetanensis NOT DETECTED  NOT DETECTED Final   Entamoeba histolytica NOT DETECTED NOT DETECTED Final   Giardia lamblia NOT DETECTED NOT DETECTED Final   Adenovirus F40/41 NOT DETECTED NOT DETECTED Final   Astrovirus NOT DETECTED NOT DETECTED Final   Norovirus GI/GII NOT DETECTED NOT DETECTED Final   Rotavirus A NOT DETECTED NOT DETECTED Final   Sapovirus (I, II, IV, and V) NOT DETECTED NOT DETECTED Final    Comment: Performed at [facility], [address], [place], [place] [address]  Urine Culture     Status: Abnormal   Collection Time: 12/30/20  6:23 AM    Specimen: Urine, Random  Result Value Ref Range Status   Specimen Description   Final    URINE, RANDOM Performed at [facility], [address], [place], [place] [address]    Special Requests   Final    NONE Performed at [facility], [address], [place], [place] [address]    Culture (A)  Final    <10,000 COLONIES/mL INSIGNIFICANT GROWTH Performed at [facility], [address], [place], [place] [address]    Report Status 12/31/2020 FINAL  Final  CULTURE, BLOOD (ROUTINE X 2) w Reflex to ID Panel     Status: None   Collection Time: 12/30/20  5:11 PM   Specimen: BLOOD  Result Value Ref Range Status   Specimen Description BLOOD RIGHT ANTECUBITAL  Final   Special Requests   Final    BOTTLES DRAWN AEROBIC AND ANAEROBIC Blood Culture adequate volume   Culture   Final    NO GROWTH 5 DAYS Performed at [facility], [address] [place], [place] [address]    Report Status 01/04/2021 FINAL  Final  CULTURE, BLOOD (ROUTINE X 2) w Reflex to ID Panel     Status: None   Collection Time: 12/30/20  7:06 PM   Specimen: BLOOD  Result Value Ref Range Status   Specimen Description BLOOD BLOOD LEFT HAND  Final   Special Requests   Final    BOTTLES DRAWN AEROBIC AND ANAEROBIC Blood Culture results may not be optimal due to an inadequate volume of blood received in culture bottles   Culture   Final    NO GROWTH 5 DAYS Performed at [facility], [address] [place], [place] [address]    Report Status 01/04/2021 FINAL  Final     Discharge Instructions:   Discharge Instructions     Diet - low sodium heart healthy   Complete by: As directed    Diet Carb Modified   Complete by: As directed    Increase activity slowly   Complete by: As directed       Allergies as of 01/04/2021       Reactions   Aripiprazole Other (See Comments)   Suicidal thoughts Suicidal thoughts        Medication List     TAKE these medications     albuterol 108 (90 Base) MCG/ACT inhaler Commonly known as: VENTOLIN HFA Inhale 1-2 puffs into the lungs every 6 (six) hours as needed for wheezing or shortness of breath.   azithromycin 500 MG tablet Commonly known as: ZITHROMAX Take 1 tablet (500 mg total) by mouth at bedtime for 2 days.   B-D UF III MINI PEN NEEDLES 31G X 5 MM Misc Generic drug: Insulin Pen Needle Inject 1 each into the skin 3 (three) times daily.   budesonide-formoterol 160-4.5 MCG/ACT inhaler Commonly known as: SYMBICORT Inhale 2 puffs into  the lungs 2 (two) times daily.   cetirizine 10 MG tablet Commonly known as: ZYRTEC Take 10 mg by mouth daily.   cyclobenzaprine 10 MG tablet Commonly known as: FLEXERIL Take 1 tablet by mouth at bedtime as needed.   diclofenac Sodium 1 % Gel Commonly known as: VOLTAREN APPLY 2 GRAMS TO AFFECTED AREA 4 TIMES A DAY   diphenoxylate-atropine 2.5-0.025 MG tablet Commonly known as: LOMOTIL Take 1 tablet by mouth 4 (four) times daily as needed for diarrhea or loose stools.   DULoxetine 20 MG capsule Commonly known as: CYMBALTA Take 20 mg by mouth 2 (two) times daily.   fluconazole 100 MG tablet Commonly known as: Diflucan Take 1 tablet (100 mg total) by mouth once for 1 dose.   fluticasone 50 MCG/ACT nasal spray Commonly known as: FLONASE Place 1 spray into both nostrils daily.   folic acid 1 MG tablet Commonly known as: FOLVITE Take 1 mg by mouth daily.   gabapentin 300 MG capsule Commonly known as: NEURONTIN Take 900 mg by mouth 3 (three) times daily.   HYDROcodone-acetaminophen 5-325 MG tablet Commonly known as: NORCO/VICODIN Take 1 tablet by mouth every 8 (eight) hours as needed for moderate pain.   losartan 25 MG tablet Commonly known as: COZAAR Take 25 mg by mouth daily.   metoprolol succinate 25 MG 24 hr tablet Commonly known as: TOPROL-XL Take 1 tablet by mouth daily.   omeprazole 40 MG capsule Commonly known as: PRILOSEC Take 1 capsule by  mouth 2 (two) times daily.   tiotropium 18 MCG inhalation capsule Commonly known as: SPIRIVA Place 18 mcg into inhaler and inhale daily.   traZODone 50 MG tablet Commonly known as: DESYREL Take 50 mg by mouth at bedtime.   Victoza 18 MG/3ML Sopn Generic drug: liraglutide Inject 1.8 mg into the skin daily.   Vitamin D (Ergocalciferol) 1.25 MG (50000 UNIT) Caps capsule Commonly known as: DRISDOL Take 1 capsule by mouth once a week.          Time coordinating discharge: 32 minutes  Signed:  Kessie Croston  [facility] 01/04/2021, 3:44 PM   Pager on [email or web address]. If 7PM-7AM, please contact night-coverage at [email or web address]

## 2021-01-04 NOTE — Progress Notes (Signed)
Discharge instructions reviewed with the patient.  Patient sent out via wheelchair with belongings 

## 2021-10-10 IMAGING — CT CT ABD-PELV W/O CM
2 of 4 series · 16 of 46 positions shown, 18 images · non-contrast
Comparison: None.

CLINICAL DATA: Abdominal pain, vomiting and diarrhea for 2 days.
Diverticulitis suspected.

EXAM:
CT ABDOMEN AND PELVIS WITHOUT CONTRAST
TECHNIQUE: Multidetector CT imaging of the abdomen and pelvis was performed
following the standard protocol without IV contrast.

[Series 2: axial st · axial · 0.98mm/px · z∈[-854,-409]mm · 13 of 97 slices shown, 15 images]
[im 4/97  soft-tissue]
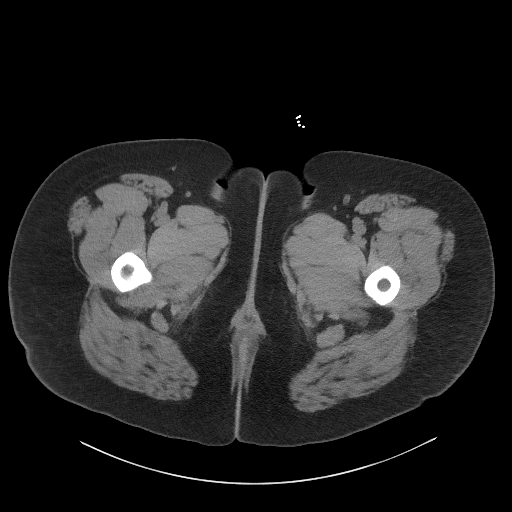
[im 4/97  bone]
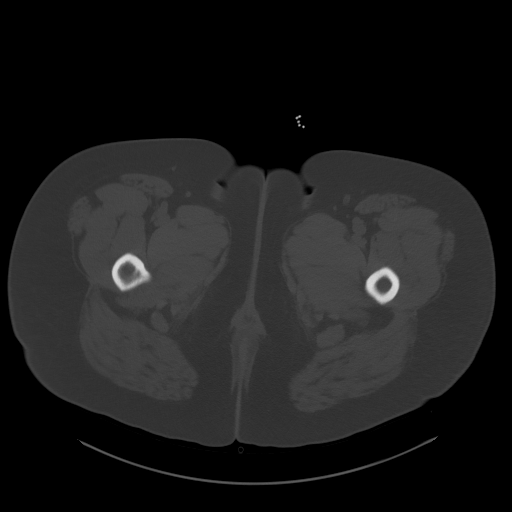
[im 12/97  soft-tissue]
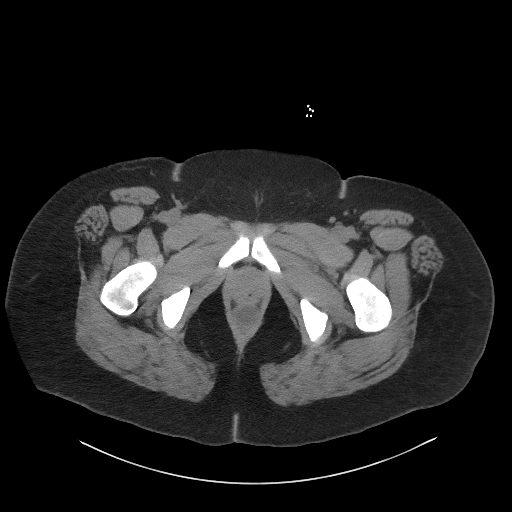
[im 20/97  soft-tissue]
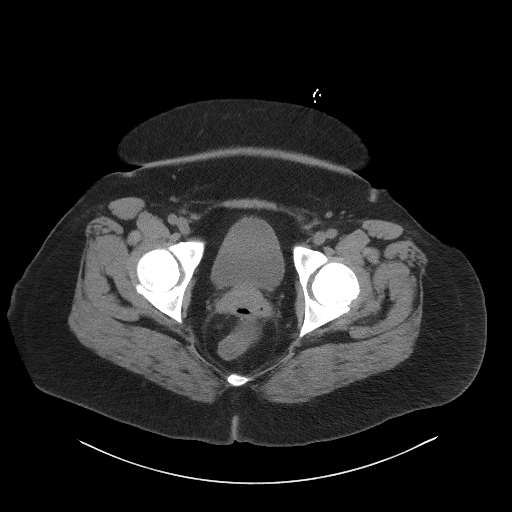
[im 27/97  soft-tissue]
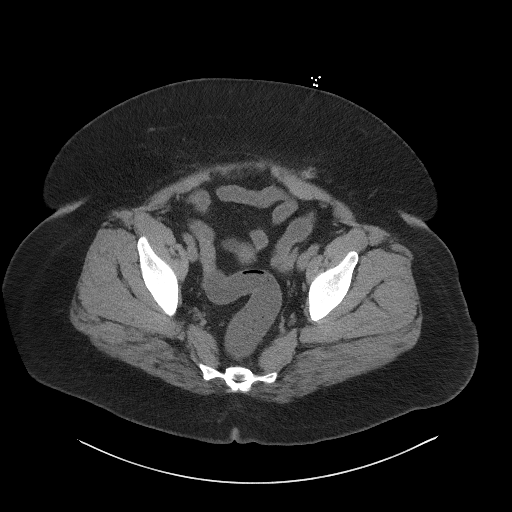
[im 35/97  soft-tissue]
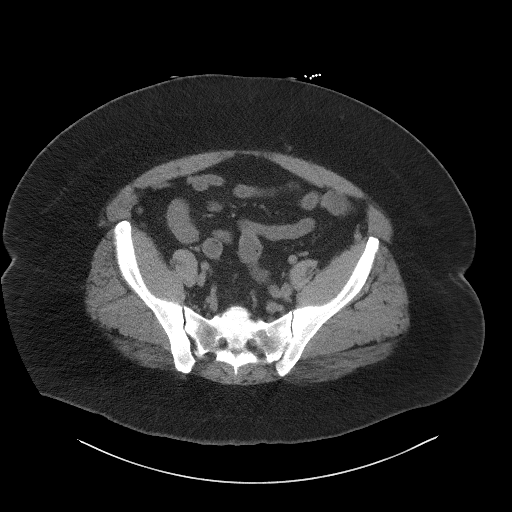
[im 43/97  soft-tissue]
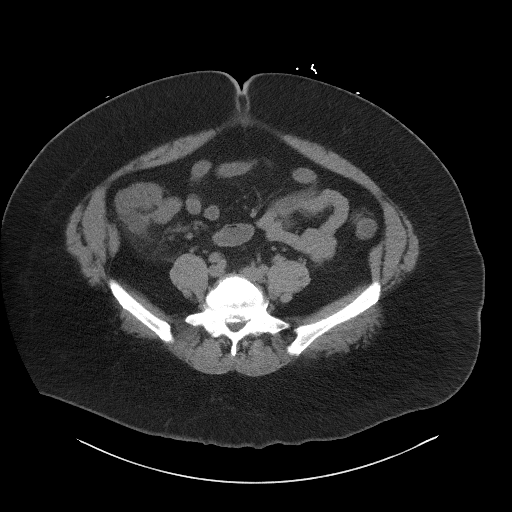
[im 50/97  soft-tissue]
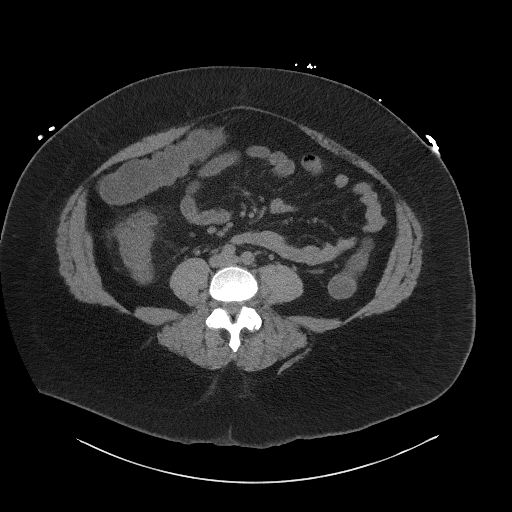
[im 54/97  soft-tissue]
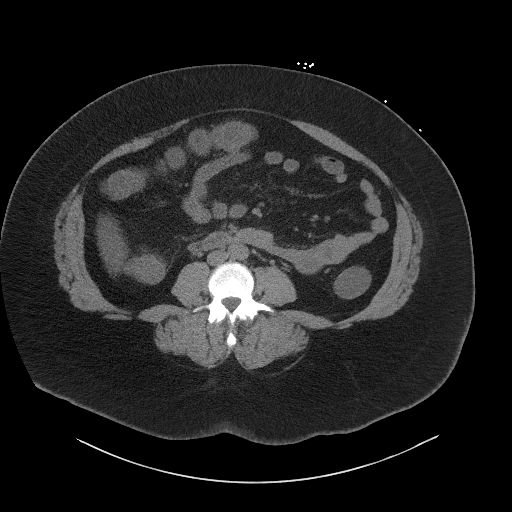
[im 62/97  soft-tissue]
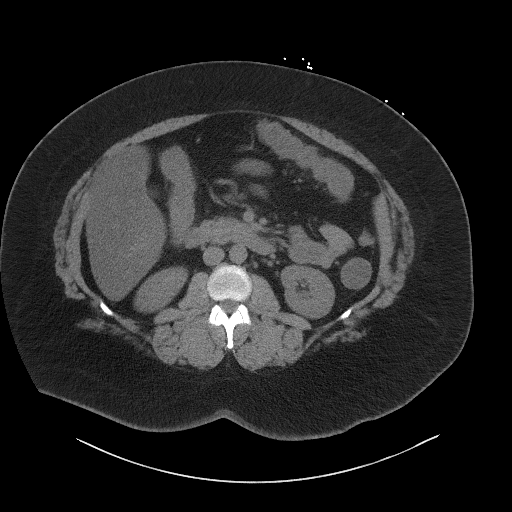
[im 62/97  bone]
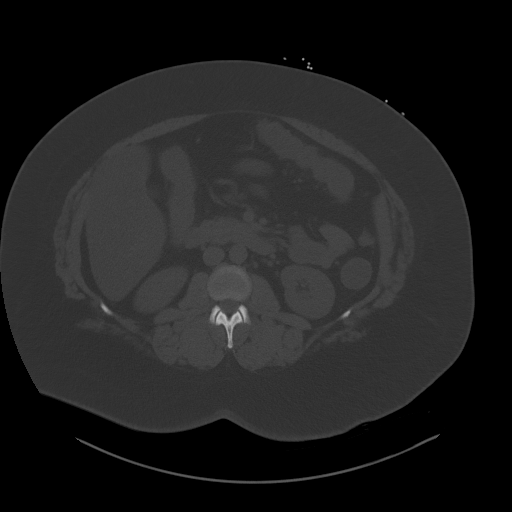
[im 70/97  soft-tissue]
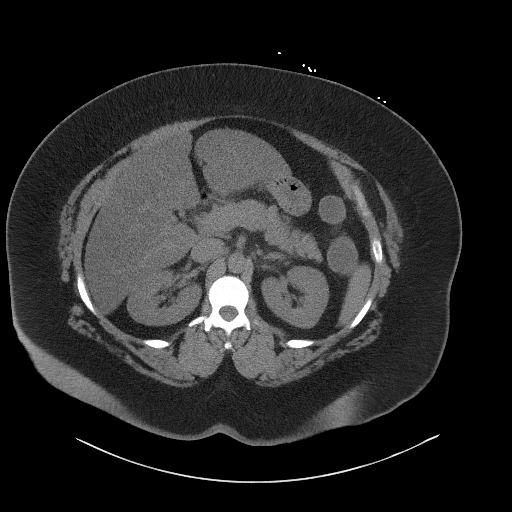
[im 77/97  soft-tissue]
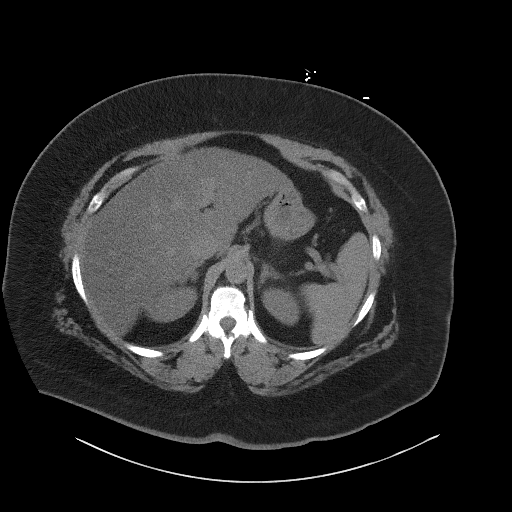
[im 85/97  soft-tissue]
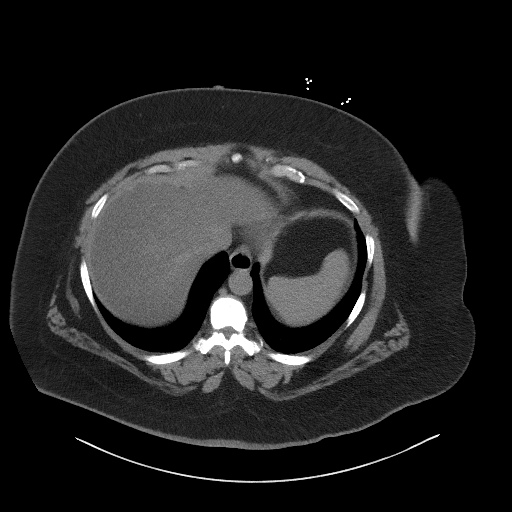
[im 93/97  soft-tissue]
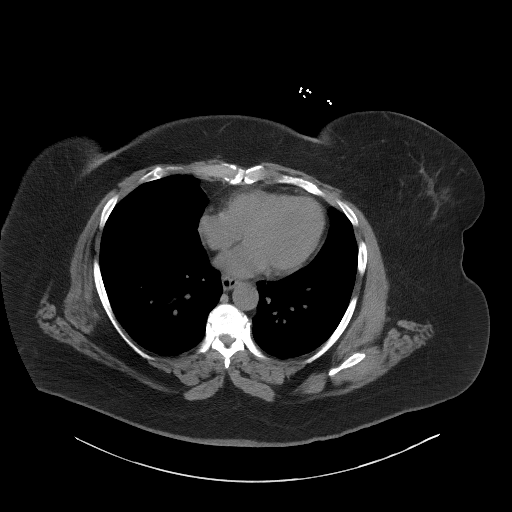

[Series 5: coronal st · coronal · 0.97mm/px · 3 of 121 slices shown]
[im 41/121  soft-tissue]
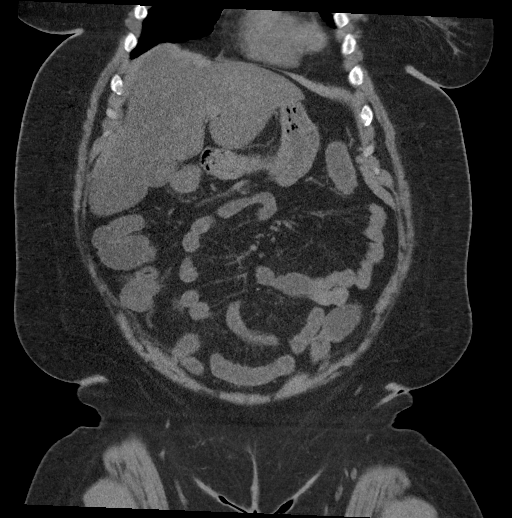
[im 54/121  soft-tissue]
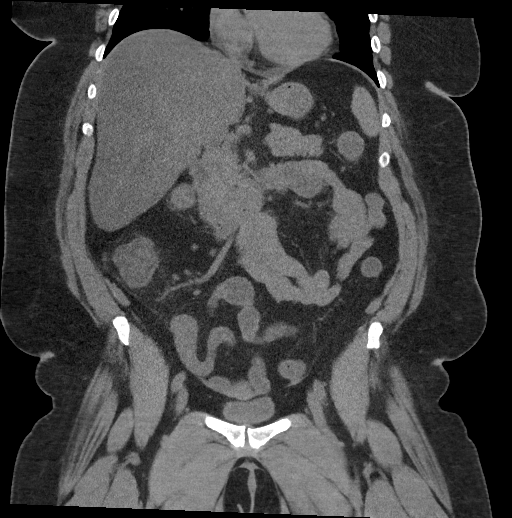
[im 67/121  soft-tissue]
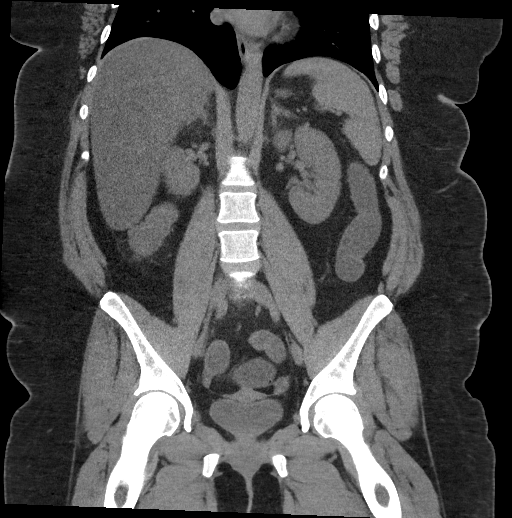

[16 of 46 positions shown; findings below may reference images not displayed]

FINDINGS: Lower chest: No acute airspace disease or pleural effusion.

Hepatobiliary: The liver is enlarged spanning 20 cm cranial caudal.
Advanced hepatic steatosis. There is no discrete focal lesion.
High-density material in the gallbladder may represent sludge or
noncalcified stones. No pericholecystic inflammation or abnormal
distention. No common bile duct dilatation.

Pancreas: No ductal dilatation or inflammation.

Spleen: Normal in size without focal abnormality. Splenule
anteriorly.

Adrenals/Urinary Tract: Normal adrenal glands. No hydronephrosis or
renal calculi. No perinephric edema. No evidence of focal renal
lesion on this noncontrast exam. Partially distended urinary
bladder. No bladder wall thickening.

Stomach/Bowel: Patulous distal esophagus. Nondistended stomach. Tiny
intramural lipoma in the duodenum, nonobstructing and of no clinical
significance. Decompressed proximal small bowel. Distal small bowel
loops are fluid-filled and mildly prominent, no transition point.
Normal appendix. Diffuse pericolonic fat stranding and liquid stool
throughout the colon. Wall thickening involving the cecum and
proximal ascending colon. No bowel pneumatosis. No significant
diverticular disease.

Vascular/Lymphatic: Small ileocolic lymph nodes are likely reactive,
but nonspecific. There is scattered central mesenteric and
retroperitoneal nodes, not enlarged by size criteria. Normal caliber
abdominal aorta. No portal venous or mesenteric gas.

Reproductive: Uterus and bilateral adnexa are unremarkable.

Other: No ascites or free air. No focal fluid collection. Small fat
containing umbilical hernia.

Musculoskeletal: There are no acute or suspicious osseous
abnormalities.
IMPRESSION: 1. Diffuse pericolonic fat stranding and liquid stool throughout the
colon consistent with colitis. Wall thickening involving the cecum
and proximal ascending colon
2. Hepatomegaly and advanced hepatic steatosis.
3. High-density material in the gallbladder may represent sludge or
noncalcified stones. No pericholecystic inflammation or abnormal
distention.

## 2022-08-31 ENCOUNTER — Other Ambulatory Visit: Payer: Self-pay | Admitting: Family Medicine

## 2022-08-31 DIAGNOSIS — Z1231 Encounter for screening mammogram for malignant neoplasm of breast: Secondary | ICD-10-CM

## 2022-09-06 ENCOUNTER — Inpatient Hospital Stay: Admission: RE | Admit: 2022-09-06 | Payer: Medicaid Other | Source: Ambulatory Visit

## 2023-06-06 ENCOUNTER — Ambulatory Visit
Admission: RE | Admit: 2023-06-06 | Discharge: 2023-06-06 | Disposition: A | Discharge status: Home / Self Care | Payer: 59 | Source: Ambulatory Visit | Attending: Family Medicine | Admitting: Family Medicine

## 2023-06-06 DIAGNOSIS — Z1231 Encounter for screening mammogram for malignant neoplasm of breast: Secondary | ICD-10-CM | POA: Diagnosis not present

## 2023-06-11 ENCOUNTER — Other Ambulatory Visit: Payer: Self-pay | Admitting: *Deleted

## 2023-06-11 ENCOUNTER — Inpatient Hospital Stay
Admission: RE | Admit: 2023-06-11 | Discharge: 2023-06-11 | Disposition: A | Discharge status: Home / Self Care | Payer: Self-pay | Source: Ambulatory Visit | Attending: Family Medicine | Admitting: Family Medicine

## 2023-06-11 DIAGNOSIS — Z1231 Encounter for screening mammogram for malignant neoplasm of breast: Secondary | ICD-10-CM

## 2023-07-12 DIAGNOSIS — F411 Generalized anxiety disorder: Secondary | ICD-10-CM | POA: Diagnosis not present

## 2023-07-12 DIAGNOSIS — F6381 Intermittent explosive disorder: Secondary | ICD-10-CM | POA: Diagnosis not present

## 2023-08-26 NOTE — Progress Notes (Unsigned)
Sleep Medicine   Office Visit  Patient Name: Priscilla Moore DOB: 10-08-1975 MRN [id number]    Chief Complaint: history of OSA  Brief History:  Priscilla Moore presents for an initial sleep evaluation with a 5+ year  history of EDS, loud snoring and witnessed apneas at night. The patient reports her sleep quality is poor due to always feeling tired. This is noted all nights. The patient's daughter has witnessed apnea and snoring at night. The patient relates the following symptoms: waking tired, snoring, choking, morning headaches, brain fog, trouble focusing and sometimes sleepy driving are also present. The patient goes to sleep at 10:30-11:00pm and wakes up at 3-6:30am. Patient reports waking 10 times each night due to restlessness and choking in her sleep. Sleep quality is worse when outside home environment.  Patient has noted restlessness of her legs at night.  The patient  relates dream enactment behavior during the night.  The patient reports a history of psychiatric problems. The Epworth Sleepiness Score is 18 out of 24 .  The patient relates  Cardiovascular risk factors include: tachycardia, hypertension.  The patient reports about 15 years ago a positive sleep study and treatment on CPAP. She lost machine after about a year.     ROS  General: (-) fever, (-) chills, (-) night sweat Nose and Sinuses: (-) nasal stuffiness or itchiness, (-) postnasal drip, (-) nosebleeds, (-) sinus trouble. Mouth and Throat: (-) sore throat, (-) hoarseness. Neck: (-) swollen glands, (-) enlarged thyroid, (-) neck pain. Respiratory: + cough, + shortness of breath, + wheezing. Neurologic: + numbness, + tingling. Psychiatric: + anxiety, + depression Sleep behavior: -sleep paralysis -hypnogogic hallucinations +dream enactment      -vivid dreams -cataplexy -night terrors -sleep walking   Current Medication: Outpatient Encounter Medications as of 08/27/2023  Medication Sig   mirtazapine (REMERON) 15 MG  tablet Take 15 mg by mouth at bedtime.   VRAYLAR 3 MG capsule Take 3 mg by mouth daily.   albuterol (VENTOLIN HFA) 108 (90 Base) MCG/ACT inhaler Inhale 1-2 puffs into the lungs every 6 (six) hours as needed for wheezing or shortness of breath.   B-D UF III MINI PEN NEEDLES 31G X 5 MM MISC Inject 1 each into the skin 3 (three) times daily.   budesonide-formoterol (SYMBICORT) 160-4.5 MCG/ACT inhaler Inhale 2 puffs into the lungs 2 (two) times daily.   celecoxib (CELEBREX) 100 MG capsule Take 100 mg by mouth 2 (two) times daily.   cetirizine (ZYRTEC) 10 MG tablet Take 10 mg by mouth daily.   cyclobenzaprine (FLEXERIL) 10 MG tablet Take 1 tablet by mouth at bedtime as needed.   diclofenac Sodium (VOLTAREN) 1 % GEL APPLY 2 GRAMS TO AFFECTED AREA 4 TIMES A DAY   diphenoxylate-atropine (LOMOTIL) 2.5-0.025 MG tablet Take 1 tablet by mouth 4 (four) times daily as needed for diarrhea or loose stools.   DULoxetine (CYMBALTA) 60 MG capsule Take 60 mg by mouth daily.   fluticasone (FLONASE) 50 MCG/ACT nasal spray Place 1 spray into both nostrils daily.   folic acid (FOLVITE) 1 MG tablet Take 1 mg by mouth daily.   gabapentin (NEURONTIN) 300 MG capsule Take 900 mg by mouth 3 (three) times daily.   losartan (COZAAR) 25 MG tablet Take 25 mg by mouth daily.   lovastatin (MEVACOR) 10 MG tablet SMARTSIG:1 Tablet(s) By Mouth Every Evening   metoprolol succinate (TOPROL-XL) 25 MG 24 hr tablet Take 1 tablet by mouth daily.   omeprazole (PRILOSEC) 40 MG capsule Take 1 capsule  by mouth 2 (two) times daily.   OZEMPIC, 2 MG/DOSE, 8 MG/3ML SOPN SMARTSIG:0.75 Milliliter(s) SUB-Q Once a Week   tiotropium (SPIRIVA) 18 MCG inhalation capsule Place 18 mcg into inhaler and inhale daily.   traZODone (DESYREL) 100 MG tablet Take 200 mg by mouth at bedtime.   VICTOZA 18 MG/3ML SOPN Inject 1.8 mg into the skin daily.   Vitamin D, Ergocalciferol, (DRISDOL) 1.25 MG (50000 UNIT) CAPS capsule Take 1 capsule by mouth once a week.    VRAYLAR 1.5 MG capsule SMARTSIG:1 Capsule(s) By Mouth Every Evening   [DISCONTINUED] DULoxetine (CYMBALTA) 20 MG capsule Take 20 mg by mouth 2 (two) times daily.   [DISCONTINUED] HYDROcodone-acetaminophen (NORCO/VICODIN) 5-325 MG tablet Take 1 tablet by mouth every 8 (eight) hours as needed for moderate pain.   [DISCONTINUED] traZODone (DESYREL) 50 MG tablet Take 50 mg by mouth at bedtime.   No facility-administered encounter medications on file as of 08/27/2023.    Surgical History: Past Surgical History:  Procedure Laterality Date   CERVICAL DISCECTOMY      Medical History: Past Medical History:  Diagnosis Date   Fibromyalgia    High cholesterol    Irregular heart beat    Knee pain, chronic     Family History: Non contributory to the present illness  Social History: Social History   Socioeconomic History   Marital status: Single    Spouse name: Not on file   Number of children: Not on file   Years of education: Not on file   Highest education level: Not on file  Occupational History   Not on file  Tobacco Use   Smoking status: Every Day    Current packs/day: 1.00    Average packs/day: 1 pack/day for 25.0 years (25.0 ttl pk-yrs)    Types: Cigarettes   Smokeless tobacco: Not on file  Substance and Sexual Activity   Alcohol use: Yes    Comment: social drinker   Drug use: No   Sexual activity: Not on file  Other Topics Concern   Not on file  Social History Narrative   Not on file   Social Drivers of Health   Financial Resource Strain: High Risk (06/12/2023)   Received from [facility]   Overall Financial Resource Strain (CARDIA)    Difficulty of Paying Living Expenses: Very hard  Food Insecurity: Food Insecurity Present (06/12/2023)   Received from [facility]   Hunger Vital Sign    Worried About Running Out of Food in the Last Year: Often true    Ran Out of Food in the Last Year: Often true  Transportation Needs: Unmet  Transportation Needs (06/12/2023)   Received from [facility]   PRAPARE - Transportation    Lack of Transportation (Non-Medical): Yes    In the past 12 months, has lack of transportation kept you from medical appointments or from getting medications?: Yes  Physical Activity: Inactive (06/12/2023)   Received from [facility]   Exercise Vital Sign    Days of Exercise per Week: 0 days    Minutes of Exercise per Session: 0 min  Stress: Stress Concern Present (06/12/2023)   Received from [facility] - Occupational Stress Questionnaire    Feeling of Stress : Very much  Social Connections: Socially Isolated (06/12/2023)   Received from [facility]   Social Connection and Isolation Panel [NHANES]    Frequency of Communication  with Friends and Family: Twice a week    Frequency of Social Gatherings with Friends and Family: Never    Attends Religious Services: More than 4 times per year    Active Member of [employer] or Organizations: No    Attends Banker Meetings: Never    Marital Status: Never married  Intimate Partner Violence: Not on file    Vital Signs: Blood pressure 118/79, pulse 78, resp. rate 12, height 5\' 8"  (1.727 m), weight 251 lb (113.9 kg), SpO2 96%. Body mass index is 38.16 kg/m.   Examination: General Appearance: The patient is well-developed, well-nourished, and in no distress. Neck Circumference: 42cm Skin: Gross inspection of skin unremarkable. Head: normocephalic, no gross deformities. Eyes: no gross deformities noted. ENT: ears appear grossly normal Neurologic: Alert and oriented. No involuntary movements.    STOP BANG RISK ASSESSMENT S (snore) Have you been told that you snore?     YES   T (tired) Are you often tired, fatigued, or sleepy during the day?   YES  O (obstruction) Do you stop breathing, choke, or gasp during sleep? YES   P  (pressure) Do you have or are you being treated for high blood pressure? YES   B (BMI) Is your body index greater than 35 kg/m? YES   A (age) Are you 75 years old or older? NO   N (neck) Do you have a neck circumference greater than 16 inches?   YES   G (gender) Are you a female? NO   TOTAL STOP/BANG "YES" ANSWERS 6                                                               A STOP-Bang score of 2 or less is considered low risk, and a score of 5 or more is high risk for having either moderate or severe OSA. For people who score 3 or 4, doctors may need to perform further assessment to determine how likely they are to have OSA.         EPWORTH SLEEPINESS SCALE:  Scale:  (0)= no chance of dozing; (1)= slight chance of dozing; (2)= moderate chance of dozing; (3)= high chance of dozing  Chance  Situtation    Sitting and reading: 3    Watching TV: 3    Sitting Inactive in public: 1    As a passenger in car: 3      Lying down to rest: 3    Sitting and talking: 1    Sitting quielty after lunch: 3    In a car, stopped in traffic: 1   TOTAL SCORE:   18 out of 24    SLEEP STUDIES:  Reported a Sleep study about 15 years ago. Positive for OSA.   LABS: No results found for this or any previous visit (from the past 2160 hours).  Radiology: MM Outside Films Mammo Result Date: 06/11/2023 This examination belongs to an outside facility and is stored here for comparison purposes only.  Contact the originating outside institution for any associated report or interpretation.   No results found.  No results found.    Assessment and Plan: Patient Active Problem List   Diagnosis Date Noted   Witnessed episode of apnea 08/27/2023   Obesity (BMI 30-39.9) 08/27/2023  Salmonella enteritis 01/03/2021   Colitis 12/29/2020   Severe sepsis (HCC) 12/29/2020   Abdominal pain, vomiting, and diarrhea 12/29/2020   AKI (acute kidney injury) (HCC) 12/29/2020   Dyslipidemia  12/29/2020   COPD (chronic obstructive pulmonary disease) (HCC) 12/29/2020   Alpha thalassemia (HCC) 12/20/2020   B12 deficiency 09/16/2019   Type 2 diabetes mellitus with hyperglycemia, without long-term current use of insulin (HCC) 09/16/2019   Cigarette nicotine dependence without complication 08/27/2019   Bipolar 1 disorder (HCC) 12/19/2013   DDD (degenerative disc disease), lumbar 12/19/2013   DDD (degenerative disc disease), cervical 12/19/2013   Gastroesophageal reflux disease without esophagitis 12/19/2013   Narcotic drug use 06/13/2013   Fibromyalgia 04/02/2013   Obstructive sleep apnea 07/10/2008   1. Witnessed episode of apnea (Primary) Patient evaluation suggests high risk of sleep disordered breathing due to history of OSA treated with cpap in the past,  (no studies are available) witnessed apnea, gasping, choking, snoring, frequent awakening and severe daytime sleepiness.  Patient has comorbid cardiovascular risk factors including: hypertension and tachycardia which could be exacerbated by pathologic sleep-disordered breathing.  Suggest: PSG to assess/treat the patient's sleep disordered breathing. The patient was also counselled on weight loss to optimize sleep health.  2. Obesity (BMI 30-39.9) Obesity Counseling: Had a lengthy discussion regarding patients BMI and weight issues. Patient was instructed on portion control as well as increased activity. Also discussed caloric restrictions with trying to maintain intake less than 2000 Kcal. Discussions were made in accordance with the 5As of weight management. Simple actions such as not eating late and if able to, taking a walk is suggested.    General Counseling: I have discussed the findings of the evaluation and examination with Priscilla Moore.  I have also discussed any further diagnostic evaluation thatmay be needed or ordered today. Priscilla Moore verbalizes understanding of the findings of todays visit. We also reviewed her medications  today and discussed drug interactions and side effects including but not limited excessive drowsiness and altered mental states. We also discussed that there is always a risk not just to her but also people around her. she has been encouraged to call the office with any questions or concerns that should arise related to todays visit.  No orders of the defined types were placed in this encounter.       I have personally obtained a history, evaluated the patient, evaluated pertinent data, formulated the assessment and plan and placed orders.   This patient was seen today by Emmaline Kluver, PA-C in collaboration with Dr. Freda Munro.   Yevonne Pax, MD [facility] Diplomate ABMS Pulmonary and Critical Care Medicine Sleep medicine

## 2023-08-27 ENCOUNTER — Ambulatory Visit: Payer: Medicaid Other | Admitting: Internal Medicine

## 2023-08-27 VITALS — BP 118/79 | HR 78 | Resp 12 | Ht 68.0 in | Wt 251.0 lb

## 2023-08-27 DIAGNOSIS — E669 Obesity, unspecified: Secondary | ICD-10-CM | POA: Diagnosis not present

## 2023-08-27 DIAGNOSIS — R0681 Apnea, not elsewhere classified: Secondary | ICD-10-CM | POA: Diagnosis not present

## 2024-02-05 ENCOUNTER — Other Ambulatory Visit: Payer: Self-pay | Admitting: Orthopedic Surgery

## 2024-02-05 DIAGNOSIS — G8929 Other chronic pain: Secondary | ICD-10-CM

## 2024-02-05 DIAGNOSIS — M51362 Other intervertebral disc degeneration, lumbar region with discogenic back pain and lower extremity pain: Secondary | ICD-10-CM

## 2024-02-05 DIAGNOSIS — M5417 Radiculopathy, lumbosacral region: Secondary | ICD-10-CM

## 2024-04-04 NOTE — Progress Notes (Deleted)
 [facility] [address] [place], [place] [address]  Pulmonary Sleep Medicine   Office Visit Note  Patient Name: Priscilla Moore DOB: 1975/09/02 MRN [id number]    Chief Complaint: Obstructive Sleep Apnea visit  Brief History:  Priscilla Moore is seen today for a follow up visit for APAP@ 4-20 cmH2O. The patient has a 5 month history of sleep apnea. Patient is not using PAP nightly.  The patient feels *** after sleeping with PAP.  The patient reports *** from PAP use. Reported sleepiness is  *** and the Epworth Sleepiness Score is *** out of 24. The patient *** take naps. The patient complains of the following: ***  The compliance download shows 2% compliance with an average use time of 3 hours 10 minutes. The AHI is 9.6.  The patient *** of limb movements disrupting sleep. The patient continues to require PAP therapy in order to eliminate sleep apnea.   ROS  General: (-) fever, (-) chills, (-) night sweat Nose and Sinuses: (-) nasal stuffiness or itchiness, (-) postnasal drip, (-) nosebleeds, (-) sinus trouble. Mouth and Throat: (-) sore throat, (-) hoarseness. Neck: (-) swollen glands, (-) enlarged thyroid, (-) neck pain. Respiratory: *** cough, *** shortness of breath, *** wheezing. Neurologic: *** numbness, *** tingling. Psychiatric: *** anxiety, *** depression   Current Medication: Outpatient Encounter Medications as of 04/07/2024  Medication Sig   albuterol (VENTOLIN HFA) 108 (90 Base) MCG/ACT inhaler Inhale 1-2 puffs into the lungs every 6 (six) hours as needed for wheezing or shortness of breath.   B-D UF III MINI PEN NEEDLES 31G X 5 MM MISC Inject 1 each into the skin 3 (three) times daily.   budesonide-formoterol  (SYMBICORT) 160-4.5 MCG/ACT inhaler Inhale 2 puffs into the lungs 2 (two) times daily.   celecoxib (CELEBREX) 100 MG capsule Take 100 mg by mouth 2 (two) times daily.   cetirizine (ZYRTEC) 10 MG tablet Take 10 mg by mouth daily.   cyclobenzaprine   (FLEXERIL ) 10 MG tablet Take 1 tablet by mouth at bedtime as needed.   diclofenac Sodium (VOLTAREN) 1 % GEL APPLY 2 GRAMS TO AFFECTED AREA 4 TIMES A DAY   diphenoxylate -atropine  (LOMOTIL ) 2.5-0.025 MG tablet Take 1 tablet by mouth 4 (four) times daily as needed for diarrhea or loose stools.   DULoxetine  (CYMBALTA ) 60 MG capsule Take 60 mg by mouth daily.   fluticasone (FLONASE) 50 MCG/ACT nasal spray Place 1 spray into both nostrils daily.   folic acid (FOLVITE) 1 MG tablet Take 1 mg by mouth daily.   gabapentin  (NEURONTIN ) 300 MG capsule Take 900 mg by mouth 3 (three) times daily.   losartan (COZAAR) 25 MG tablet Take 25 mg by mouth daily.   lovastatin (MEVACOR) 10 MG tablet SMARTSIG:1 Tablet(s) By Mouth Every Evening   metoprolol succinate (TOPROL-XL) 25 MG 24 hr tablet Take 1 tablet by mouth daily.   mirtazapine (REMERON) 15 MG tablet Take 15 mg by mouth at bedtime.   omeprazole (PRILOSEC) 40 MG capsule Take 1 capsule by mouth 2 (two) times daily.   OZEMPIC, 2 MG/DOSE, 8 MG/3ML SOPN SMARTSIG:0.75 Milliliter(s) SUB-Q Once a Week   tiotropium (SPIRIVA ) 18 MCG inhalation capsule Place 18 mcg into inhaler and inhale daily.   traZODone  (DESYREL ) 100 MG tablet Take 200 mg by mouth at bedtime.   VICTOZA  18 MG/3ML SOPN Inject 1.8 mg into the skin daily.   Vitamin D, Ergocalciferol, (DRISDOL) 1.25 MG (50000 UNIT) CAPS capsule Take 1 capsule by mouth once a week.   VRAYLAR  1.5 MG capsule SMARTSIG:1 Capsule(s) By Mouth Every Evening   VRAYLAR 3 MG capsule Take 3 mg by mouth daily.   No facility-administered encounter medications on file as of 04/07/2024.    Surgical History: Past Surgical History:  Procedure Laterality Date   CERVICAL DISCECTOMY      Medical History: Past Medical History:  Diagnosis Date   Fibromyalgia    High cholesterol    Irregular heart beat    Knee pain, chronic     Family History: Non contributory to the present illness  Social History: Social History    Socioeconomic History   Marital status: Single    Spouse name: Not on file   Number of children: Not on file   Years of education: Not on file   Highest education level: Not on file  Occupational History   Not on file  Tobacco Use   Smoking status: Every Day    Current packs/day: 1.00    Average packs/day: 1 pack/day for 25.0 years (25.0 ttl pk-yrs)    Types: Cigarettes   Smokeless tobacco: Not on file  Substance and Sexual Activity   Alcohol use: Yes    Comment: social drinker   Drug use: No   Sexual activity: Not on file  Other Topics Concern   Not on file  Social History Narrative   Not on file   Social Drivers of Health   Financial Resource Strain: Low Risk  (01/24/2024)   Received from [facility]   Overall Financial Resource Strain (CARDIA)    Difficulty of Paying Living Expenses: Not hard at all  Food Insecurity: No Food Insecurity (01/24/2024)   Received from [facility]   Hunger Vital Sign    Within the past 12 months, you worried that your food would run out before you got the money to buy more.: Never true    Within the past 12 months, the food you bought just didn't last and you didn't have money to get more.: Never true  Transportation Needs: No Transportation Needs (01/24/2024)   Received from [facility] - Transportation    In the past 12 months, has lack of transportation kept you from medical appointments or from getting medications?: No    Lack of Transportation (Non-Medical): No  Physical Activity: Inactive (06/12/2023)   Received from [facility]   Exercise Vital Sign    On average, how many minutes do you engage in exercise at this level?: 0 min    On average, how many days per week do you engage in moderate to strenuous exercise (like a brisk walk)?: 0 days  Stress: Stress Concern Present (06/12/2023)   Received from [facility]  Occupational Health - Occupational Stress Questionnaire    Feeling of Stress : Very much  Social Connections: Socially Isolated (06/12/2023)   Received from [facility]   Social Connection and Isolation Panel    In a typical week, how many times do you talk on the phone with family, friends, or neighbors?: Twice a week    How often do you get together with friends or relatives?: Never    How often do you attend church or religious services?: More than 4 times per year    Do you belong to any clubs or organizations such as church groups, unions, fraternal or athletic groups, or school groups?: No    How often do you  attend meetings of the clubs or organizations you belong to?: Never    Are you married, widowed, divorced, separated, never married, or living with a partner?: Never married  Intimate Partner Violence: Not on file    Vital Signs: There were no vitals taken for this visit. There is no height or weight on file to calculate BMI.    Examination: General Appearance: The patient is well-developed, well-nourished, and in no distress. Neck Circumference: 42 cm Skin: Gross inspection of skin unremarkable. Head: normocephalic, no gross deformities. Eyes: no gross deformities noted. ENT: ears appear grossly normal Neurologic: Alert and oriented. No involuntary movements.  STOP BANG RISK ASSESSMENT S (snore) Have you been told that you snore?     YES   T (tired) Are you often tired, fatigued, or sleepy during the day?   YES  O (obstruction) Do you stop breathing, choke, or gasp during sleep? YES   P (pressure) Do you have or are you being treated for high blood pressure? YES   B (BMI) Is your body index greater than 35 kg/m? YES   A (age) Are you 55 years old or older? YES   N (neck) Do you have a neck circumference greater than 16 inches?   YES   G (gender) Are you a female? NO   TOTAL STOP/BANG "YES" ANSWERS        A STOP-Bang score of 2 or less is  considered low risk, and a score of 5 or more is high risk for having either moderate or severe OSA. For people who score 3 or 4, doctors may need to perform further assessment to determine how likely they are to have OSA.         EPWORTH SLEEPINESS SCALE:  Scale:  (0)= no chance of dozing; (1)= slight chance of dozing; (2)= moderate chance of dozing; (3)= high chance of dozing  Chance  Situtation    Sitting and reading: ***    Watching TV: ***    Sitting Inactive in public: ***    As a passenger in car: ***      Lying down to rest: ***    Sitting and talking: ***    Sitting quielty after lunch: ***    In a car, stopped in traffic: ***   TOTAL SCORE:   *** out of 24    SLEEP STUDIES:  HST (09/2023) AHI 7.7/Hr, min SpO2 85%   CPAP COMPLIANCE DATA:  Date Range: 12/06/2023-04/02/2024  Average Daily Use: 3 hours 10 minutes  Median Use: 2 hours 39 minutes  Compliance for > 4 Hours: 2%  AHI: 9.6 respiratory events per hour  Days Used: 5/119 days  Mask Leak: 57.8  95th Percentile Pressure: 9.8         LABS: No results found for this or any previous visit (from the past 2160 hours).  Radiology: MM Outside Films Mammo Result Date: 06/11/2023 This examination belongs to an outside facility and is stored here for comparison purposes only.  Contact the originating outside institution for any associated report or interpretation.   No results found.  No results found.    Assessment and Plan: Patient Active Problem List   Diagnosis Date Noted   Witnessed episode of apnea 08/27/2023   Obesity (BMI 30-39.9) 08/27/2023   Salmonella enteritis 01/03/2021   Colitis 12/29/2020   Severe sepsis (HCC) 12/29/2020   Abdominal pain, vomiting, and diarrhea 12/29/2020   AKI (acute kidney injury) 12/29/2020   Dyslipidemia 12/29/2020   COPD (  chronic obstructive pulmonary disease) (HCC) 12/29/2020   Alpha thalassemia 12/20/2020   B12 deficiency 09/16/2019   Type  2 diabetes mellitus with hyperglycemia, without long-term current use of insulin  (HCC) 09/16/2019   Cigarette nicotine dependence without complication 08/27/2019   Bipolar 1 disorder (HCC) 12/19/2013   DDD (degenerative disc disease), lumbar 12/19/2013   DDD (degenerative disc disease), cervical 12/19/2013   Gastroesophageal reflux disease without esophagitis 12/19/2013   Narcotic drug use 06/13/2013   Fibromyalgia 04/02/2013   Obstructive sleep apnea 07/10/2008      The patient *** tolerate PAP and reports *** benefit from PAP use. The patient was reminded how to *** and advised to ***. The patient was also counselled on ***. The compliance is ***. The AHI is ***.   ***  General Counseling: I have discussed the findings of the evaluation and examination with Priscilla Moore.  I have also discussed any further diagnostic evaluation thatmay be needed or ordered today. Priscilla Moore verbalizes understanding of the findings of todays visit. We also reviewed Priscilla Moore medications today and discussed drug interactions and side effects including but not limited excessive drowsiness and altered mental states. We also discussed that there is always a risk not just to Priscilla Moore but also people around Priscilla Moore. Priscilla Moore has been encouraged to call the office with any questions or concerns that should arise related to todays visit.  No orders of the defined types were placed in this encounter.       I have personally obtained a history, examined the patient, evaluated laboratory and imaging results, formulated the assessment and plan and placed orders.  Elfreda DELENA Bathe, MD [facility] Diplomate ABMS Pulmonary Critical Care Medicine and Sleep Medicine

## 2024-04-07 ENCOUNTER — Ambulatory Visit

## 2024-04-12 ENCOUNTER — Inpatient Hospital Stay: Admission: RE | Admit: 2024-04-12 | Source: Ambulatory Visit

## 2024-04-23 ENCOUNTER — Inpatient Hospital Stay
Admission: RE | Admit: 2024-04-23 | Discharge: 2024-04-23 | Disposition: A | Source: Ambulatory Visit | Attending: Orthopedic Surgery | Admitting: Orthopedic Surgery

## 2024-04-23 DIAGNOSIS — M5417 Radiculopathy, lumbosacral region: Secondary | ICD-10-CM

## 2024-04-23 DIAGNOSIS — G8929 Other chronic pain: Secondary | ICD-10-CM

## 2024-04-23 DIAGNOSIS — M51362 Other intervertebral disc degeneration, lumbar region with discogenic back pain and lower extremity pain: Secondary | ICD-10-CM

## 2024-05-28 ENCOUNTER — Other Ambulatory Visit: Payer: Self-pay | Admitting: Family Medicine

## 2024-05-28 DIAGNOSIS — Z1231 Encounter for screening mammogram for malignant neoplasm of breast: Secondary | ICD-10-CM

## 2024-06-30 ENCOUNTER — Ambulatory Visit

## 2024-07-29 ENCOUNTER — Ambulatory Visit
Admission: RE | Admit: 2024-07-29 | Discharge: 2024-07-29 | Disposition: A | Discharge status: Home / Self Care | Source: Ambulatory Visit | Attending: Family Medicine | Admitting: Family Medicine

## 2024-07-29 DIAGNOSIS — Z1231 Encounter for screening mammogram for malignant neoplasm of breast: Secondary | ICD-10-CM | POA: Insufficient documentation
